# Patient Record
Sex: Female | Born: 1965 | Race: Black or African American | Hispanic: No | Marital: Single | Smoking: Never smoker
Health system: Southern US, Community
[De-identification: ages and names within clinical notes are randomized; demographics above are authoritative.]

## PROBLEM LIST (undated history)

## (undated) DIAGNOSIS — I639 Cerebral infarction, unspecified: Secondary | ICD-10-CM

## (undated) DIAGNOSIS — J329 Chronic sinusitis, unspecified: Secondary | ICD-10-CM

## (undated) DIAGNOSIS — K220 Achalasia of cardia: Secondary | ICD-10-CM

## (undated) DIAGNOSIS — I10 Essential (primary) hypertension: Secondary | ICD-10-CM

## (undated) DIAGNOSIS — D649 Anemia, unspecified: Secondary | ICD-10-CM

## (undated) DIAGNOSIS — J302 Other seasonal allergic rhinitis: Secondary | ICD-10-CM

## (undated) DIAGNOSIS — E7800 Pure hypercholesterolemia, unspecified: Secondary | ICD-10-CM

## (undated) DIAGNOSIS — H548 Legal blindness, as defined in USA: Secondary | ICD-10-CM

## (undated) HISTORY — PX: SKIN BIOPSY: SHX1

## (undated) HISTORY — DX: Cerebral infarction, unspecified: I63.9

## (undated) HISTORY — PX: VITRECTOMY: SHX106

## (undated) HISTORY — PX: BREAST SURGERY: SHX581

## (undated) HISTORY — PX: BREAST EXCISIONAL BIOPSY: SUR124

---

## 1997-12-14 ENCOUNTER — Encounter: Admission: RE | Admit: 1997-12-14 | Discharge: 1998-03-14 | Payer: Self-pay | Admitting: Internal Medicine

## 1998-04-03 HISTORY — PX: EYE SURGERY: SHX253

## 1998-04-21 ENCOUNTER — Ambulatory Visit (HOSPITAL_BASED_OUTPATIENT_CLINIC_OR_DEPARTMENT_OTHER): Admission: RE | Admit: 1998-04-21 | Discharge: 1998-04-21 | Payer: Self-pay | Admitting: *Deleted

## 1998-07-07 ENCOUNTER — Emergency Department (HOSPITAL_COMMUNITY): Admission: EM | Admit: 1998-07-07 | Discharge: 1998-07-07 | Payer: Self-pay | Admitting: Emergency Medicine

## 1998-07-10 ENCOUNTER — Emergency Department (HOSPITAL_COMMUNITY): Admission: EM | Admit: 1998-07-10 | Discharge: 1998-07-10 | Payer: Self-pay | Admitting: Emergency Medicine

## 1999-04-01 ENCOUNTER — Other Ambulatory Visit: Admission: RE | Admit: 1999-04-01 | Discharge: 1999-04-01 | Payer: Self-pay | Admitting: *Deleted

## 2000-04-16 ENCOUNTER — Other Ambulatory Visit: Admission: RE | Admit: 2000-04-16 | Discharge: 2000-04-16 | Payer: Self-pay | Admitting: *Deleted

## 2000-04-19 ENCOUNTER — Encounter: Payer: Self-pay | Admitting: *Deleted

## 2000-04-19 ENCOUNTER — Encounter: Admission: RE | Admit: 2000-04-19 | Discharge: 2000-04-19 | Payer: Self-pay | Admitting: Family Medicine

## 2000-10-15 ENCOUNTER — Encounter: Payer: Self-pay | Admitting: Obstetrics and Gynecology

## 2000-10-15 ENCOUNTER — Encounter: Admission: RE | Admit: 2000-10-15 | Discharge: 2000-10-15 | Payer: Self-pay | Admitting: Obstetrics and Gynecology

## 2001-04-09 ENCOUNTER — Encounter: Payer: Self-pay | Admitting: Obstetrics and Gynecology

## 2001-04-09 ENCOUNTER — Encounter: Admission: RE | Admit: 2001-04-09 | Discharge: 2001-04-09 | Payer: Self-pay | Admitting: Obstetrics and Gynecology

## 2001-05-01 ENCOUNTER — Other Ambulatory Visit: Admission: RE | Admit: 2001-05-01 | Discharge: 2001-05-01 | Payer: Self-pay | Admitting: Obstetrics and Gynecology

## 2001-10-18 ENCOUNTER — Encounter: Admission: RE | Admit: 2001-10-18 | Discharge: 2001-10-18 | Payer: Self-pay | Admitting: Obstetrics and Gynecology

## 2001-10-18 ENCOUNTER — Encounter: Payer: Self-pay | Admitting: Obstetrics and Gynecology

## 2005-08-08 ENCOUNTER — Encounter: Admission: RE | Admit: 2005-08-08 | Discharge: 2005-08-08 | Payer: Self-pay | Admitting: Obstetrics and Gynecology

## 2006-02-21 ENCOUNTER — Encounter: Admission: RE | Admit: 2006-02-21 | Discharge: 2006-02-21 | Payer: Self-pay | Admitting: *Deleted

## 2006-03-08 ENCOUNTER — Encounter: Admission: RE | Admit: 2006-03-08 | Discharge: 2006-03-08 | Payer: Self-pay | Admitting: *Deleted

## 2006-03-13 ENCOUNTER — Encounter: Admission: RE | Admit: 2006-03-13 | Discharge: 2006-03-13 | Payer: Self-pay | Admitting: *Deleted

## 2006-04-03 HISTORY — PX: BREAST BIOPSY: SHX20

## 2006-04-27 ENCOUNTER — Encounter (INDEPENDENT_AMBULATORY_CARE_PROVIDER_SITE_OTHER): Payer: Self-pay | Admitting: *Deleted

## 2006-04-27 ENCOUNTER — Ambulatory Visit (HOSPITAL_COMMUNITY): Admission: RE | Admit: 2006-04-27 | Discharge: 2006-04-27 | Payer: Self-pay | Admitting: *Deleted

## 2006-08-13 ENCOUNTER — Encounter: Admission: RE | Admit: 2006-08-13 | Discharge: 2006-08-13 | Payer: Self-pay | Admitting: Obstetrics and Gynecology

## 2007-08-22 ENCOUNTER — Encounter: Admission: RE | Admit: 2007-08-22 | Discharge: 2007-08-22 | Payer: Self-pay | Admitting: Obstetrics and Gynecology

## 2008-01-19 IMAGING — MG MM DIAGNOSTIC UNILATERAL R
6 series · 6 of 6 positions shown · non-contrast
Comparison: none

DG DIAGNOSTIC UNILATERAL R
CC and MLO view(s) were taken of the right breast.

RIGHT BREAST ULTRASOUND
DIGITAL UNILATERAL RIGHT DIAGNOSTIC MAMMOGRAM AND RIGHT BREAST ULTRASOUND:
CLINICAL DATA: 40-year-old with mass, right breast.

[R CC (1 of 2)]
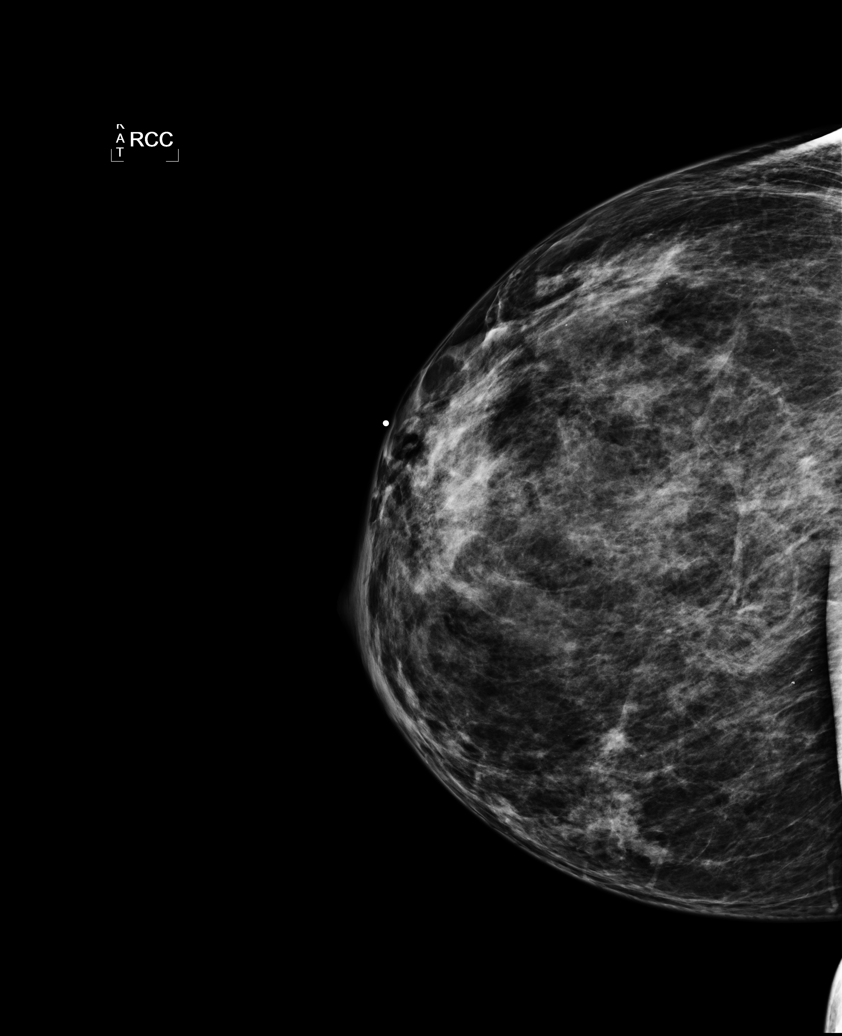

[R MLO]
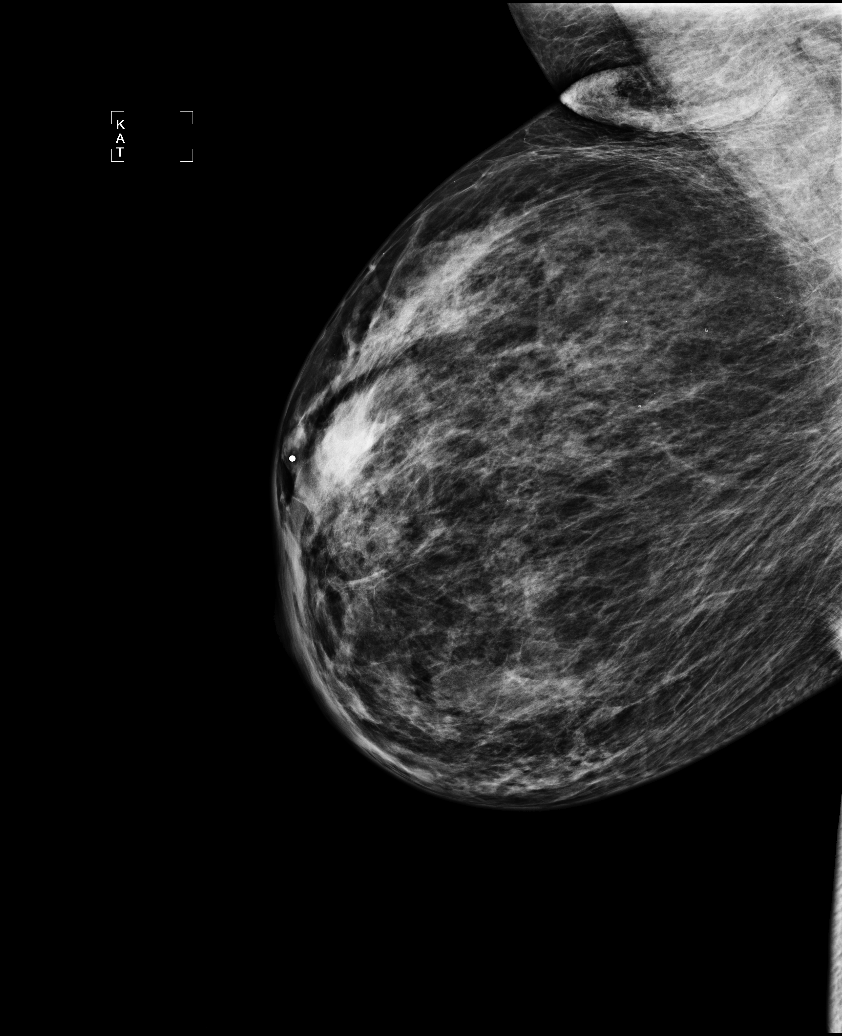

[R TAN (1 of 2)]
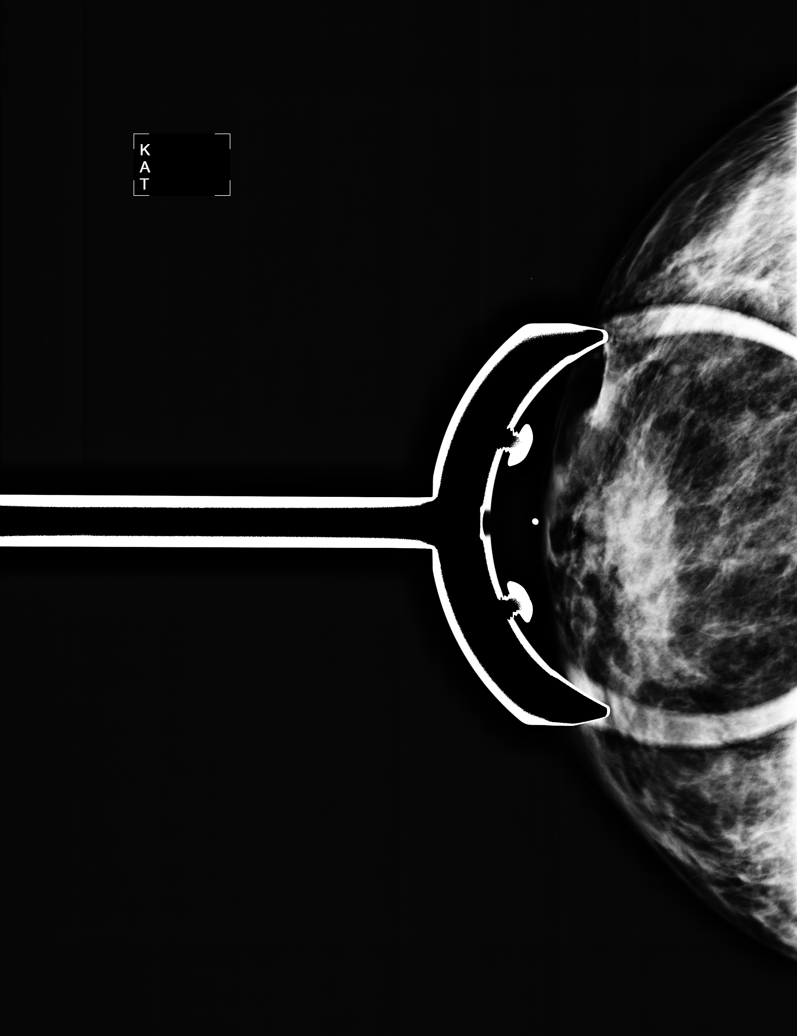

[R TAN (2 of 2)]
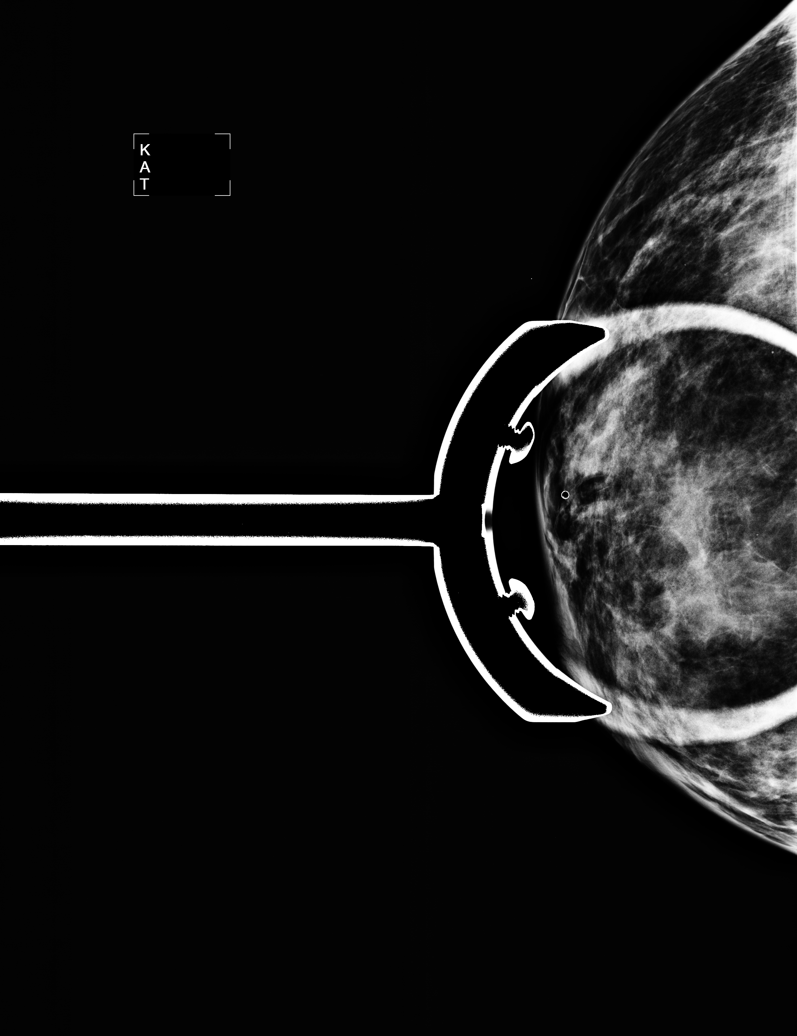

[R CC (2 of 2)]
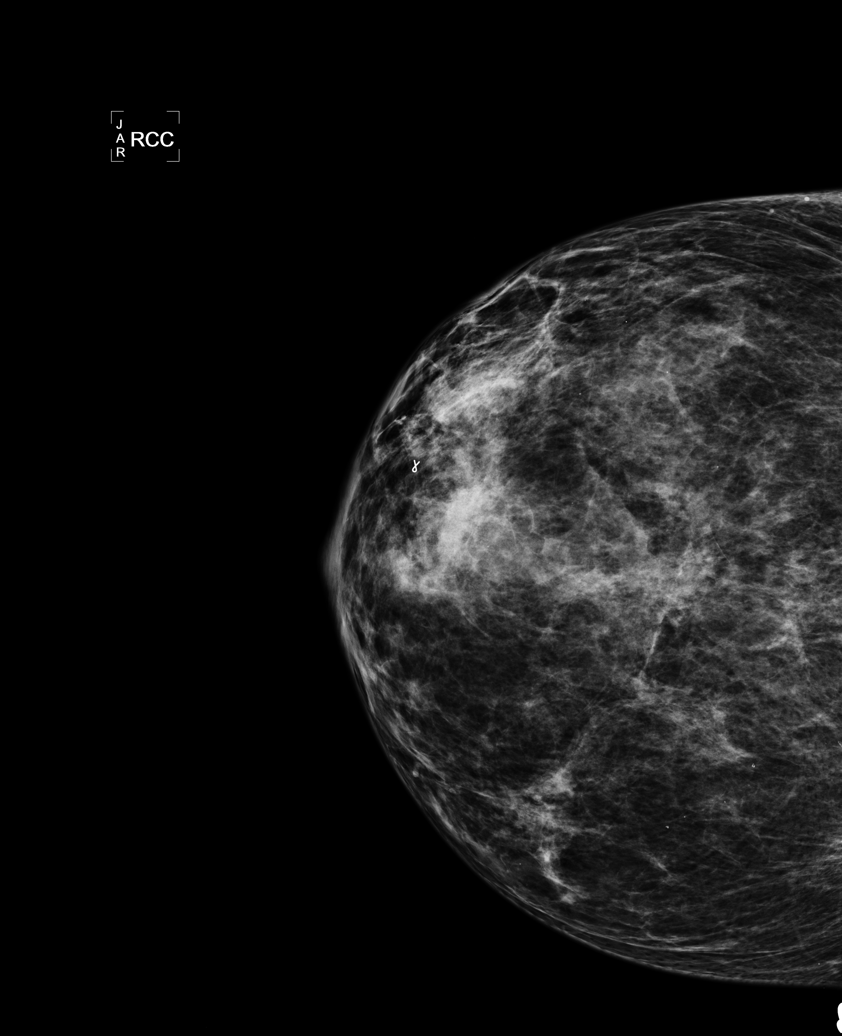

[R ML]
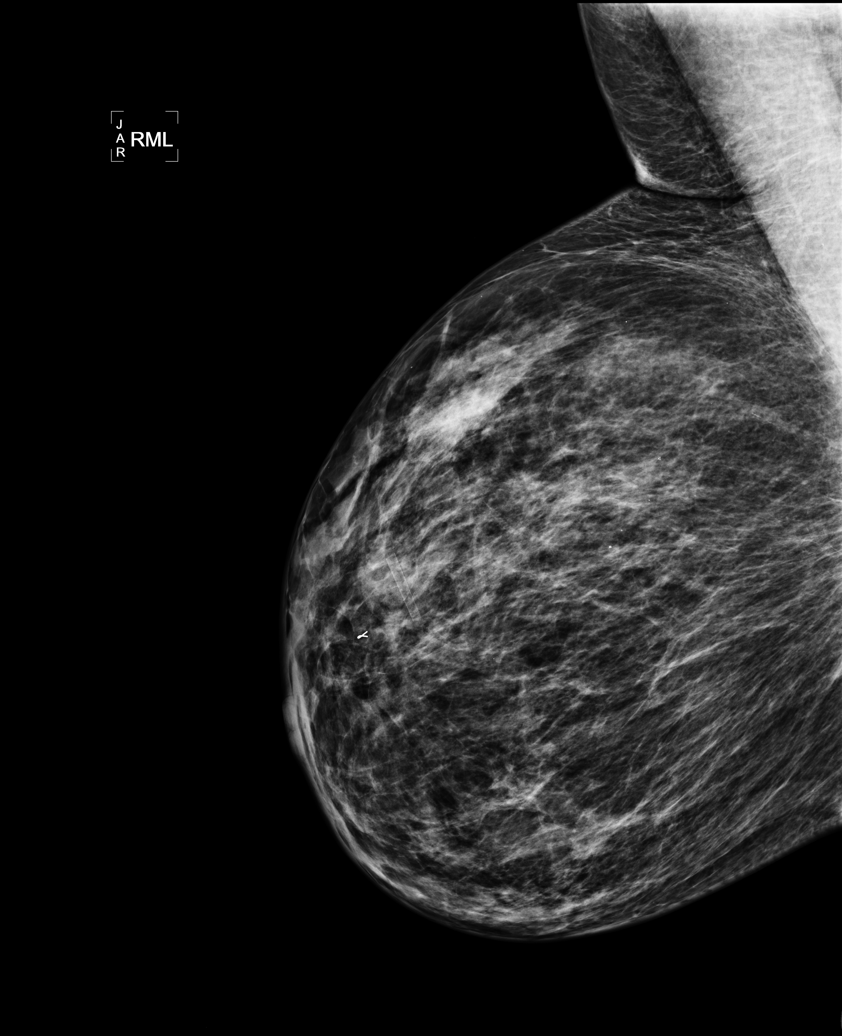

[6 of 6 positions shown; findings below may reference images not displayed]

Comparison 08-08-05.  Breast parenchyma is dense fibroglandular.  There is asymmetric density in the 
anterior portion of the breast in the upper outer quadrant on the right compared to the left.  No 
discrete mass or suspicious calcifications are identified.

On physical exam, I do palpate a firm mass in the 10 - 11 o'clock position of the right breast.  
Ultrasound is performed of this region showing irregular hypoechoic mass associated with dense 
acoustic shadowing.  This is at least 3.2 x 1.6 cm.  The findings are suspicious for malignancy and
biopsy is suggested.  Images of the right axilla show no enlarged axillary lymph nodes.
IMPRESSION: Biopsy is suggested for palpable mass in the 10 o'clock position of the right breast.

ASSESSMENT: Suspicious - BI-RADS 4

Needle biopsy of the right breast.
,

## 2008-08-24 ENCOUNTER — Encounter: Admission: RE | Admit: 2008-08-24 | Discharge: 2008-08-24 | Payer: Self-pay | Admitting: Obstetrics and Gynecology

## 2009-08-25 ENCOUNTER — Encounter: Admission: RE | Admit: 2009-08-25 | Discharge: 2009-08-25 | Payer: Self-pay | Admitting: Obstetrics and Gynecology

## 2010-04-24 ENCOUNTER — Encounter: Payer: Self-pay | Admitting: Obstetrics and Gynecology

## 2010-07-21 ENCOUNTER — Other Ambulatory Visit: Payer: Self-pay | Admitting: Obstetrics and Gynecology

## 2010-07-21 DIAGNOSIS — Z1231 Encounter for screening mammogram for malignant neoplasm of breast: Secondary | ICD-10-CM

## 2010-08-19 NOTE — Op Note (Signed)
Jill Foley, Jill Foley             ACCOUNT NO.:  [id number]   MEDICAL RECORD NO.:  [id number]          PATIENT TYPE:  AMB   LOCATION:  DAY                          FACILITY:  [facility]   PHYSICIAN:  Alfonse Ras, MD   DATE OF BIRTH:  08-04-1965   DATE OF PROCEDURE:  04/27/2006  DATE OF DISCHARGE:                               OPERATIVE REPORT   PREOPERATIVE DIAGNOSIS:  Right breast mass.   POSTOPERATIVE DIAGNOSIS:  Right breast mass.   PROCEDURE:  Right breast biopsy.   SURGEON:  Dr. Baruch Merl.   ANESTHESIA:  General.   DESCRIPTION OF PROCEDURE:  The patient was taken to the operating room,  placed in supine position. After adequate anesthesia was induced using  laryngeal mask, the right breast was prepped and draped in normal  sterile fashion. Using a curvilinear in the upper outer quadrant of the  right breast near the areolae, I dissected down onto appeared to be very  very fibrotic dense tissue. This was consistent with a mass effect.  This was excised in its entirety using Bovie electrocautery and sent for  pathologic evaluation.  Adequate hemostasis was ensured and the skin was  closed with a subcuticular 3-0 Monocryl.  Steri-Strips and dressings  were applied.  The patient tolerated the procedure well and went to PACU  in good condition.      Alfonse Ras, MD  Electronically Signed     KRE/MEDQ  D:  04/27/2006  T:  04/27/2006  Job:  [id number]

## 2010-08-30 ENCOUNTER — Ambulatory Visit: Payer: Self-pay

## 2010-09-01 ENCOUNTER — Ambulatory Visit
Admission: RE | Admit: 2010-09-01 | Discharge: 2010-09-01 | Disposition: A | Discharge status: Home / Self Care | Payer: Medicare Other | Source: Ambulatory Visit | Attending: Obstetrics and Gynecology | Admitting: Obstetrics and Gynecology

## 2010-09-01 DIAGNOSIS — Z1231 Encounter for screening mammogram for malignant neoplasm of breast: Secondary | ICD-10-CM

## 2010-09-28 ENCOUNTER — Other Ambulatory Visit: Payer: Self-pay | Admitting: Obstetrics and Gynecology

## 2011-04-04 DIAGNOSIS — I639 Cerebral infarction, unspecified: Secondary | ICD-10-CM

## 2011-04-04 HISTORY — DX: Cerebral infarction, unspecified: I63.9

## 2011-07-31 ENCOUNTER — Other Ambulatory Visit: Payer: Self-pay | Admitting: Obstetrics and Gynecology

## 2011-07-31 DIAGNOSIS — Z1231 Encounter for screening mammogram for malignant neoplasm of breast: Secondary | ICD-10-CM

## 2011-09-04 ENCOUNTER — Ambulatory Visit
Admission: RE | Admit: 2011-09-04 | Discharge: 2011-09-04 | Disposition: A | Discharge status: Home / Self Care | Payer: Medicare Other | Source: Ambulatory Visit | Attending: Obstetrics and Gynecology | Admitting: Obstetrics and Gynecology

## 2011-09-04 DIAGNOSIS — Z1231 Encounter for screening mammogram for malignant neoplasm of breast: Secondary | ICD-10-CM

## 2011-10-01 ENCOUNTER — Emergency Department (HOSPITAL_BASED_OUTPATIENT_CLINIC_OR_DEPARTMENT_OTHER): Payer: No Typology Code available for payment source

## 2011-10-01 ENCOUNTER — Encounter (HOSPITAL_BASED_OUTPATIENT_CLINIC_OR_DEPARTMENT_OTHER): Payer: Self-pay | Admitting: *Deleted

## 2011-10-01 ENCOUNTER — Emergency Department (HOSPITAL_BASED_OUTPATIENT_CLINIC_OR_DEPARTMENT_OTHER)
Admission: EM | Admit: 2011-10-01 | Discharge: 2011-10-01 | Disposition: A | Discharge status: Home / Self Care | Payer: No Typology Code available for payment source | Attending: Emergency Medicine | Admitting: Emergency Medicine

## 2011-10-01 DIAGNOSIS — Z79899 Other long term (current) drug therapy: Secondary | ICD-10-CM | POA: Insufficient documentation

## 2011-10-01 DIAGNOSIS — Y92410 Unspecified street and highway as the place of occurrence of the external cause: Secondary | ICD-10-CM | POA: Insufficient documentation

## 2011-10-01 DIAGNOSIS — M25569 Pain in unspecified knee: Secondary | ICD-10-CM | POA: Insufficient documentation

## 2011-10-01 DIAGNOSIS — E7800 Pure hypercholesterolemia, unspecified: Secondary | ICD-10-CM | POA: Insufficient documentation

## 2011-10-01 DIAGNOSIS — M545 Low back pain, unspecified: Secondary | ICD-10-CM | POA: Insufficient documentation

## 2011-10-01 DIAGNOSIS — Z7982 Long term (current) use of aspirin: Secondary | ICD-10-CM | POA: Insufficient documentation

## 2011-10-01 DIAGNOSIS — I10 Essential (primary) hypertension: Secondary | ICD-10-CM | POA: Insufficient documentation

## 2011-10-01 DIAGNOSIS — Z9109 Other allergy status, other than to drugs and biological substances: Secondary | ICD-10-CM | POA: Insufficient documentation

## 2011-10-01 DIAGNOSIS — E119 Type 2 diabetes mellitus without complications: Secondary | ICD-10-CM | POA: Insufficient documentation

## 2011-10-01 DIAGNOSIS — S39012A Strain of muscle, fascia and tendon of lower back, initial encounter: Secondary | ICD-10-CM

## 2011-10-01 HISTORY — DX: Chronic sinusitis, unspecified: J32.9

## 2011-10-01 HISTORY — DX: Essential (primary) hypertension: I10

## 2011-10-01 HISTORY — DX: Other seasonal allergic rhinitis: J30.2

## 2011-10-01 HISTORY — DX: Pure hypercholesterolemia, unspecified: E78.00

## 2011-10-01 MED ORDER — CYCLOBENZAPRINE HCL 5 MG PO TABS: 5.0000 mg | ORAL_TABLET | Freq: Two times a day (BID) | ORAL | Status: AC | PRN

## 2011-10-01 MED ORDER — CYCLOBENZAPRINE HCL 10 MG PO TABS
5.0000 mg | ORAL_TABLET | Freq: Once | ORAL | Status: AC
  Administered 2011-10-01: 5 mg via ORAL
  Filled 2011-10-01: qty 1

## 2011-10-01 MED ORDER — HYDROCODONE-ACETAMINOPHEN 5-500 MG PO TABS: 1.0000 | ORAL_TABLET | Freq: Four times a day (QID) | ORAL | Status: AC | PRN
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
## 2011-10-01 NOTE — ED Notes (Signed)
Pt c/o muscle spasms in her left leg. Given warm compress and VP advised.

## 2011-10-01 NOTE — ED Provider Notes (Signed)
History/physical exam/procedure(s) were performed by non-physician practitioner and as supervising physician I was immediately available for consultation/collaboration. I have reviewed all notes and am in agreement with care and plan.   Hilario Quarry, MD 10/01/11 2127
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
## 2011-10-06 ENCOUNTER — Encounter: Payer: Self-pay | Admitting: Family Medicine

## 2011-10-06 ENCOUNTER — Ambulatory Visit (INDEPENDENT_AMBULATORY_CARE_PROVIDER_SITE_OTHER): Payer: Medicare Other | Admitting: Family Medicine

## 2011-10-06 VITALS — BP 118/70 | Ht 70.0 in | Wt 195.0 lb

## 2011-10-06 DIAGNOSIS — M545 Low back pain: Secondary | ICD-10-CM
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
## 2011-10-09 DIAGNOSIS — M545 Low back pain: Secondary | ICD-10-CM | POA: Insufficient documentation
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
## 2011-10-20 ENCOUNTER — Emergency Department (HOSPITAL_BASED_OUTPATIENT_CLINIC_OR_DEPARTMENT_OTHER): Payer: Medicare Other

## 2011-10-20 ENCOUNTER — Encounter (HOSPITAL_BASED_OUTPATIENT_CLINIC_OR_DEPARTMENT_OTHER): Payer: Self-pay | Admitting: *Deleted

## 2011-10-20 ENCOUNTER — Observation Stay (HOSPITAL_BASED_OUTPATIENT_CLINIC_OR_DEPARTMENT_OTHER)
Admission: EM | Admit: 2011-10-20 | Discharge: 2011-10-21 | Disposition: A | Discharge status: Home / Self Care | Payer: Medicare Other | Attending: Emergency Medicine | Admitting: Emergency Medicine

## 2011-10-20 ENCOUNTER — Observation Stay (HOSPITAL_COMMUNITY): Payer: Medicare Other

## 2011-10-20 DIAGNOSIS — R209 Unspecified disturbances of skin sensation: Principal | ICD-10-CM | POA: Insufficient documentation

## 2011-10-20 DIAGNOSIS — I10 Essential (primary) hypertension: Secondary | ICD-10-CM | POA: Insufficient documentation

## 2011-10-20 DIAGNOSIS — E119 Type 2 diabetes mellitus without complications: Secondary | ICD-10-CM | POA: Insufficient documentation

## 2011-10-20 DIAGNOSIS — R202 Paresthesia of skin: Secondary | ICD-10-CM

## 2011-10-20 DIAGNOSIS — R9089 Other abnormal findings on diagnostic imaging of central nervous system: Secondary | ICD-10-CM

## 2011-10-20 DIAGNOSIS — E7800 Pure hypercholesterolemia, unspecified: Secondary | ICD-10-CM | POA: Insufficient documentation

## 2011-10-20 DIAGNOSIS — G459 Transient cerebral ischemic attack, unspecified: Secondary | ICD-10-CM

## 2011-10-20 HISTORY — DX: Achalasia of cardia: K22.0

## 2011-10-20 LAB — COMPREHENSIVE METABOLIC PANEL
CO2: 27 mEq/L (ref 19–32)
Calcium: 9.3 mg/dL (ref 8.4–10.5)
Creatinine, Ser: 1.2 mg/dL — ABNORMAL HIGH (ref 0.50–1.10)
GFR calc Af Amer: 62 mL/min — ABNORMAL LOW (ref >90)
GFR calc non Af Amer: 53 mL/min — ABNORMAL LOW (ref >90)
Glucose, Bld: 130 mg/dL — ABNORMAL HIGH (ref 70–99)

## 2011-10-20 LAB — CBC
Hemoglobin: 11.2 g/dL — ABNORMAL LOW (ref 12.0–15.0)
MCH: 25.9 pg — ABNORMAL LOW (ref 26.0–34.0)
MCV: 81.3 fL (ref 78.0–100.0)
RBC: 4.32 MIL/uL (ref 3.87–5.11)

## 2011-10-20 LAB — URINALYSIS, ROUTINE W REFLEX MICROSCOPIC
Glucose, UA: NEGATIVE mg/dL
Hgb urine dipstick: NEGATIVE
Ketones, ur: NEGATIVE mg/dL
Protein, ur: NEGATIVE mg/dL

## 2011-10-20 LAB — PROTIME-INR: Prothrombin Time: 13.3 seconds (ref 11.6–15.2)

## 2011-10-20 LAB — HEMOGLOBIN A1C: Mean Plasma Glucose: 140 mg/dL — ABNORMAL HIGH (ref <117)

## 2011-10-20 MED ORDER — ENOXAPARIN SODIUM 40 MG/0.4ML ~~LOC~~ SOLN
40.0000 mg | SUBCUTANEOUS | Status: DC
  Administered 2011-10-20: 40 mg via SUBCUTANEOUS
  Filled 2011-10-20 (×2): qty 0.4

## 2011-10-20 MED ORDER — ASPIRIN 325 MG PO TABS
325.0000 mg | ORAL_TABLET | Freq: Once | ORAL | Status: AC
  Administered 2011-10-20: 325 mg via ORAL
  Filled 2011-10-20: qty 1

## 2011-10-20 NOTE — ED Notes (Signed)
Pt transported to MRI 

## 2011-10-20 NOTE — ED Notes (Signed)
Patient states she was showering approximately on hour pta and developed a sudden tingling in her right ear which began to radiated down right neck and arm.  States approximately 15 pta she developed light headedness, lasting approximatley 5 minutes which resolved with rest. Denies any lightheadedness at this time.

## 2011-10-20 NOTE — ED Notes (Signed)
Report given to Lori in CDU. 
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
## 2011-10-20 NOTE — Progress Notes (Signed)
*  PRELIMINARY RESULTS* Vascular Ultrasound Carotid Duplex (Doppler) has been completed.  Preliminary findings: Bilaterally no significant ICA stenosis with antegrade vertebral flow.  Farrel Demark RDMS 10/20/2011, 7:28 PM

## 2011-10-20 NOTE — ED Provider Notes (Signed)
Medical screening examination/treatment/procedure(s) were performed by non-physician practitioner and as supervising physician I was immediately available for consultation/collaboration.   Reice Bienvenue, MD 10/20/11 2350 

## 2011-10-20 NOTE — ED Provider Notes (Signed)
Pt with right ear, arm tingling onset this morning. Pt was transferred here, from [facility]. Pt is being taken care of by Dr. Ranae Palms and PA Storm Frisk. I did however, went in to check on her. Her symptoms are all resolved. Her results of MRI and labs were discussed with her. Pt aware of a plan to have ECHO and lipids tomorrow am, disposition based on results.   Lottie Mussel, PA 10/20/11 2229

## 2011-10-20 NOTE — ED Notes (Signed)
Pt returned from MRI °

## 2011-10-20 NOTE — ED Notes (Signed)
[facility] has arrived  
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
## 2011-10-20 NOTE — ED Provider Notes (Signed)
History     CSN: [id number]  Arrival date & time 10/20/11  1223   First MD Initiated Contact with Patient 10/20/11 1245      Chief Complaint  Patient presents with  . Tingling    right ear, neck and arm    (Consider location/radiation/quality/duration/timing/severity/associated sxs/prior treatment) HPI Pt with multiple medical problems reports a short time prior to arrival while taking a shower she began to have tingling in her R face, progressed to R arm and leg and lasted for less than an hour. She still has some residual ringing in R ear and tingling in R hand, but symptoms have improved significantly. She did not have any facial droop, slurred speech or weakness.   Past Medical History  Diagnosis Date  . Diabetes mellitus   . Hypertension   . Hypercholesteremia   . Seasonal allergies   . Sinus infection   . Achalasia     Past Surgical History  Procedure Date  . Skin biopsy   . Eye surgery   . Breast surgery     Family History  Problem Relation Age of Onset  . Hypertension Mother   . Diabetes Mother   . Hypertension Father   . Sudden death Neg Hx   . Heart attack Neg Hx   . Diabetes Other   . Hypertension Other   . Hyperlipidemia Other     History  Substance Use Topics  . Smoking status: Never Smoker   . Smokeless tobacco: Not on file  . Alcohol Use: No    OB History    Grav Para Term Preterm Abortions TAB SAB Ect Mult Living                  Review of Systems All other systems reviewed and are negative except as noted in HPI.   Allergies  Fish allergy and Ivp dye  Home Medications   Current Outpatient Rx  Name Route Sig Dispense Refill  . ASPIRIN 81 MG PO TABS Oral Take 81 mg by mouth daily.    Marland Kitchen BIOTIN PO Oral Take 1 tablet by mouth daily.    Marland Kitchen VITAMIN D 1000 UNITS PO TABS Oral Take 2,000 Units by mouth daily.    Marland Kitchen FLUTICASONE PROPIONATE 50 MCG/ACT NA SUSP Nasal Place 1 spray into the nose every morning.    Marland Kitchen GABAPENTIN PO Oral Take 1  capsule by mouth 2 (two) times daily.    Marland Kitchen LISINOPRIL-HYDROCHLOROTHIAZIDE 20-12.5 MG PO TABS Oral Take 1 tablet by mouth daily.    Marland Kitchen MAGNESIUM OXIDE PO Oral Take 1 tablet by mouth daily.    . MELOXICAM PO Oral Take 1 tablet by mouth daily.    Marland Kitchen METFORMIN HCL 500 MG PO TABS Oral Take 500 mg by mouth 2 (two) times daily with a meal.    . METHYLCELLULOSE 1 % OP SOLN Both Eyes Place 1 drop into both eyes 2 (two) times daily.    Marland Kitchen PRAVASTATIN SODIUM 40 MG PO TABS Oral Take 40 mg by mouth daily.      BP 141/85  Pulse 88  Temp 98.2 F (36.8 C) (Oral)  Resp 20  SpO2 100%  Physical Exam  Nursing note and vitals reviewed. Constitutional: She is oriented to person, place, and time. She appears well-developed and well-nourished.  HENT:  Head: Normocephalic and atraumatic.  Right Ear: Tympanic membrane and ear canal normal.  Left Ear: Tympanic membrane normal.       L ear cerumen impaction removed  with curette  Eyes: EOM are normal. Pupils are equal, round, and reactive to light.  Neck: Normal range of motion. Neck supple.  Cardiovascular: Normal rate, normal heart sounds and intact distal pulses.   Pulmonary/Chest: Effort normal and breath sounds normal.  Abdominal: Bowel sounds are normal. She exhibits no distension. There is no tenderness.  Musculoskeletal: Normal range of motion. She exhibits no edema and no tenderness.  Neurological: She is alert and oriented to person, place, and time. She has normal strength and normal reflexes. No cranial nerve deficit or sensory deficit. Coordination and gait normal.       Subjective change in sensation to R hand, but fine touch sensation is intact  Skin: Skin is warm and dry. No rash noted.  Psychiatric: She has a normal mood and affect.    ED Course  Procedures (including critical care time)  Labs Reviewed  CBC - Abnormal; Notable for the following:    Hemoglobin 11.2 (*)     HCT 35.1 (*)     MCH 25.9 (*)     All other components within  normal limits  COMPREHENSIVE METABOLIC PANEL - Abnormal; Notable for the following:    Glucose, Bld 130 (*)     Creatinine, Ser 1.20 (*)     Total Bilirubin 0.2 (*)     GFR calc non Af Amer 53 (*)     GFR calc Af Amer 62 (*)     All other components within normal limits  PROTIME-INR  APTT  URINALYSIS, ROUTINE W REFLEX MICROSCOPIC  HEMOGLOBIN A1C   Ct Head Wo Contrast  10/20/2011  *RADIOLOGY REPORT*  Clinical Data: Bowing of the right side  CT HEAD WITHOUT CONTRAST  Technique:  Contiguous axial images were obtained from the base of the skull through the vertex without contrast.  Comparison: None.  Findings: The brain has a normal appearance without evidence of malformation, atrophy, old or acute infarction, mass lesion, hemorrhage, hydrocephalus or extra-axial collection.  The calvarium is unremarkable.  Sinuses, middle ears and mastoids are clear.  IMPRESSION: Normal head CT  Original Report Authenticated By: Thomasenia Sales, M.D.     No diagnosis found.    MDM  Likely TIA. Will begin workup here, but anticipate transfer to [facility] for TIA Obs protocol.     Date: 10/20/2011  Rate: 75  Rhythm: normal sinus rhythm  QRS Axis: normal  Intervals: normal  ST/T Wave abnormalities: normal  Conduction Disutrbances: none  Narrative Interpretation: unremarkable No change from previous  3:22 PM Pt's symptoms unchanged. CT and labs as above. Discussed with Dr. Ranae Palms and Lemont Fillers, PA at [facility] who will accept in transfer to [facility] for TIA protocol.      Janann Boeve B. Bernette Mayers, MD 10/20/11 1524

## 2011-10-20 NOTE — ED Provider Notes (Signed)
Medical screening examination/treatment/procedure(s) were performed by non-physician practitioner and as supervising physician I was immediately available for consultation/collaboration.   Kaiser Belluomini, MD 10/20/11 2349 

## 2011-10-21 LAB — LIPID PANEL
Cholesterol: 165 mg/dL (ref 0–200)
HDL: 85 mg/dL (ref >39)
Total CHOL/HDL Ratio: 1.9 RATIO

## 2011-10-21 MED ORDER — STROKE: EARLY STAGES OF RECOVERY BOOK
Freq: Once | Status: DC
  Filled 2011-10-21: qty 1

## 2011-10-21 MED ORDER — ASPIRIN 81 MG PO CHEW: 81.0000 mg | CHEWABLE_TABLET | Freq: Every day | ORAL | Status: DC

## 2011-10-21 NOTE — ED Notes (Cosign Needed)
11:36 AM Pt seen and examined by me again in CDU this am. Pt overnight on TIA protocol. Pt has completed all the testing, however, still pending results of an echocardiogram.   Pt in NAD. Resting comfortably. Lungs clear bilat, regular hr and rhythm. Neurovascularly intact. Asymptomatic at present.     Results were reviewd, I spoke with Dr. Thad Ranger, neurology, regarding the findings on the MRI brain, and she does not believe there is any evidence of an acute ischemia, and does not believe a diagnosis of demyelinating disease could be made from this particular study. She recommended starting pt on 81mg  asa daily and outpatient follow up with neurology.   11:50 AM Echo normal. . PT informed of all the findings. Suspicion for pre diabetes. Pt will be followed up with PCP and neurology for further evaluation and treatment. Will start on baby asa.   Pt in NAD, VS normal, regular HR and rhythm, lungs clear bilaterally, no neurofocal deficits, normal strength, coordination, gait.   Pt stable for d/c home.   Lottie Mussel, PA 10/21/11 1151

## 2011-10-21 NOTE — Progress Notes (Signed)
  Echocardiogram 2D Echocardiogram has been performed.  Georgian Co 10/21/2011, 9:30 AM

## 2011-10-30 ENCOUNTER — Other Ambulatory Visit: Payer: Self-pay | Admitting: Neurology

## 2011-10-30 DIAGNOSIS — R9409 Abnormal results of other function studies of central nervous system: Secondary | ICD-10-CM

## 2011-11-03 ENCOUNTER — Ambulatory Visit: Payer: Medicare Other | Admitting: Family Medicine

## 2011-11-03 ENCOUNTER — Ambulatory Visit (INDEPENDENT_AMBULATORY_CARE_PROVIDER_SITE_OTHER): Payer: Medicare Other | Admitting: Family Medicine

## 2011-11-03 VITALS — BP 122/76 | HR 69 | Ht 70.0 in | Wt 202.0 lb

## 2011-11-03 DIAGNOSIS — M545 Low back pain: Secondary | ICD-10-CM

## 2011-11-04 ENCOUNTER — Ambulatory Visit
Admission: RE | Admit: 2011-11-04 | Discharge: 2011-11-04 | Disposition: A | Discharge status: Home / Self Care | Payer: Medicare Other | Source: Ambulatory Visit | Attending: Neurology | Admitting: Neurology

## 2011-11-04 DIAGNOSIS — R9409 Abnormal results of other function studies of central nervous system: Secondary | ICD-10-CM

## 2011-11-04 MED ORDER — GADOBENATE DIMEGLUMINE 529 MG/ML IV SOLN
19.0000 mL | Freq: Once | INTRAVENOUS | Status: AC | PRN
  Administered 2011-11-04: 19 mL via INTRAVENOUS

## 2011-11-07 ENCOUNTER — Encounter: Payer: Self-pay | Admitting: Family Medicine
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
## 2011-11-15 NOTE — Progress Notes (Signed)
Observation review for this date is complete.

## 2011-12-22 ENCOUNTER — Ambulatory Visit (INDEPENDENT_AMBULATORY_CARE_PROVIDER_SITE_OTHER): Payer: Medicare Other | Admitting: Family Medicine

## 2011-12-22 ENCOUNTER — Encounter: Payer: Self-pay | Admitting: Family Medicine

## 2011-12-22 VITALS — BP 125/73 | HR 71 | Ht 70.0 in | Wt 220.5 lb

## 2011-12-22 DIAGNOSIS — M545 Low back pain: Secondary | ICD-10-CM

## 2011-12-23 ENCOUNTER — Ambulatory Visit (HOSPITAL_BASED_OUTPATIENT_CLINIC_OR_DEPARTMENT_OTHER)
Admission: RE | Admit: 2011-12-23 | Discharge: 2011-12-23 | Disposition: A | Discharge status: Home / Self Care | Payer: Medicare Other | Source: Ambulatory Visit | Attending: Family Medicine | Admitting: Family Medicine

## 2011-12-23 DIAGNOSIS — M5137 Other intervertebral disc degeneration, lumbosacral region: Secondary | ICD-10-CM | POA: Insufficient documentation

## 2011-12-23 DIAGNOSIS — M545 Low back pain, unspecified: Secondary | ICD-10-CM | POA: Insufficient documentation

## 2011-12-23 DIAGNOSIS — M51379 Other intervertebral disc degeneration, lumbosacral region without mention of lumbar back pain or lower extremity pain: Secondary | ICD-10-CM | POA: Insufficient documentation

## 2011-12-23 DIAGNOSIS — M5380 Other specified dorsopathies, site unspecified: Secondary | ICD-10-CM | POA: Insufficient documentation

## 2011-12-23 DIAGNOSIS — M79609 Pain in unspecified limb: Secondary | ICD-10-CM | POA: Insufficient documentation

## 2011-12-23 DIAGNOSIS — R209 Unspecified disturbances of skin sensation: Secondary | ICD-10-CM | POA: Insufficient documentation

## 2011-12-24 ENCOUNTER — Encounter: Payer: Self-pay | Admitting: Family Medicine
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
## 2011-12-26 NOTE — Addendum Note (Signed)
Addended by: Lenda Kelp on: 12/26/2011 08:46 AM   Modules accepted: Orders

## 2012-04-01 ENCOUNTER — Ambulatory Visit: Payer: Medicare Other | Attending: Physical Medicine and Rehabilitation | Admitting: Rehabilitation

## 2012-04-01 DIAGNOSIS — M545 Low back pain, unspecified: Secondary | ICD-10-CM | POA: Insufficient documentation

## 2012-04-01 DIAGNOSIS — M2569 Stiffness of other specified joint, not elsewhere classified: Secondary | ICD-10-CM | POA: Insufficient documentation

## 2012-04-01 DIAGNOSIS — IMO0001 Reserved for inherently not codable concepts without codable children: Secondary | ICD-10-CM | POA: Insufficient documentation

## 2012-04-05 ENCOUNTER — Ambulatory Visit: Payer: Medicare Other | Attending: Physical Medicine and Rehabilitation | Admitting: Rehabilitation

## 2012-04-05 DIAGNOSIS — M2569 Stiffness of other specified joint, not elsewhere classified: Secondary | ICD-10-CM | POA: Insufficient documentation

## 2012-04-05 DIAGNOSIS — M545 Low back pain, unspecified: Secondary | ICD-10-CM | POA: Insufficient documentation

## 2012-04-05 DIAGNOSIS — IMO0001 Reserved for inherently not codable concepts without codable children: Secondary | ICD-10-CM | POA: Insufficient documentation

## 2012-04-09 ENCOUNTER — Ambulatory Visit: Payer: Medicare Other | Admitting: Rehabilitation

## 2012-04-11 ENCOUNTER — Ambulatory Visit: Payer: Medicare Other | Admitting: Rehabilitation

## 2012-04-15 ENCOUNTER — Ambulatory Visit: Payer: Medicare Other | Admitting: Rehabilitation

## 2012-04-17 ENCOUNTER — Ambulatory Visit: Payer: Medicare Other | Admitting: Rehabilitation

## 2012-04-22 ENCOUNTER — Ambulatory Visit: Payer: Medicare Other | Admitting: Rehabilitation

## 2012-04-24 ENCOUNTER — Ambulatory Visit: Payer: Medicare Other | Admitting: Rehabilitation

## 2012-04-25 ENCOUNTER — Ambulatory Visit: Payer: Medicare Other | Admitting: Rehabilitation

## 2012-04-29 ENCOUNTER — Ambulatory Visit: Payer: Medicare Other | Admitting: Rehabilitation

## 2012-05-01 ENCOUNTER — Ambulatory Visit: Payer: Medicare Other | Admitting: Rehabilitation

## 2012-05-03 ENCOUNTER — Ambulatory Visit: Payer: Medicare Other | Admitting: Rehabilitation

## 2012-05-06 ENCOUNTER — Ambulatory Visit: Payer: Medicare Other | Attending: Physical Medicine and Rehabilitation | Admitting: Rehabilitation

## 2012-05-06 DIAGNOSIS — M2569 Stiffness of other specified joint, not elsewhere classified: Secondary | ICD-10-CM | POA: Insufficient documentation

## 2012-05-06 DIAGNOSIS — M545 Low back pain, unspecified: Secondary | ICD-10-CM | POA: Insufficient documentation

## 2012-05-06 DIAGNOSIS — IMO0001 Reserved for inherently not codable concepts without codable children: Secondary | ICD-10-CM | POA: Insufficient documentation

## 2012-05-08 ENCOUNTER — Ambulatory Visit: Payer: Medicare Other | Admitting: Rehabilitation

## 2012-05-14 ENCOUNTER — Ambulatory Visit: Payer: Medicare Other | Admitting: Rehabilitation

## 2012-05-15 ENCOUNTER — Ambulatory Visit: Payer: Medicare Other | Admitting: Rehabilitation

## 2012-05-16 ENCOUNTER — Ambulatory Visit: Payer: Medicare Other | Admitting: Rehabilitation

## 2012-05-20 ENCOUNTER — Ambulatory Visit: Payer: Medicare Other | Admitting: Rehabilitation

## 2012-05-21 ENCOUNTER — Ambulatory Visit: Payer: Medicare Other | Admitting: Rehabilitation

## 2012-05-23 ENCOUNTER — Ambulatory Visit: Payer: Medicare Other | Admitting: Rehabilitation

## 2012-05-28 ENCOUNTER — Ambulatory Visit: Payer: Medicare Other | Admitting: Rehabilitation

## 2012-05-30 ENCOUNTER — Ambulatory Visit: Payer: Medicare Other | Admitting: Rehabilitation

## 2012-07-31 ENCOUNTER — Other Ambulatory Visit: Payer: Self-pay

## 2012-07-31 DIAGNOSIS — Z1231 Encounter for screening mammogram for malignant neoplasm of breast: Secondary | ICD-10-CM

## 2012-08-29 ENCOUNTER — Encounter: Payer: Self-pay | Admitting: Neurology

## 2012-08-29 DIAGNOSIS — R9089 Other abnormal findings on diagnostic imaging of central nervous system: Secondary | ICD-10-CM

## 2012-08-29 DIAGNOSIS — R209 Unspecified disturbances of skin sensation: Secondary | ICD-10-CM | POA: Insufficient documentation

## 2012-08-29 DIAGNOSIS — G459 Transient cerebral ischemic attack, unspecified: Secondary | ICD-10-CM | POA: Insufficient documentation

## 2012-09-04 ENCOUNTER — Encounter: Payer: Self-pay | Admitting: Neurology

## 2012-09-04 ENCOUNTER — Ambulatory Visit (INDEPENDENT_AMBULATORY_CARE_PROVIDER_SITE_OTHER): Payer: Medicare Other | Admitting: Neurology

## 2012-09-04 VITALS — BP 121/72 | HR 76 | Temp 97.9°F | Ht 69.5 in | Wt 192.0 lb

## 2012-09-04 DIAGNOSIS — G459 Transient cerebral ischemic attack, unspecified: Secondary | ICD-10-CM

## 2012-09-04 DIAGNOSIS — E785 Hyperlipidemia, unspecified: Secondary | ICD-10-CM

## 2012-09-04 NOTE — Patient Instructions (Addendum)
Come back for Carotid dopplers studies. Follow up visit in 6 months.  STROKE/TIA INSTRUCTIONS SMOKING Cigarette smoking nearly doubles your risk of having a stroke & is the single most alterable risk factor  If you smoke or have smoked in the last 12 months, you are advised to quit smoking for your health.  Most of the excess cardiovascular risk related to smoking disappears within a year of stopping.  Ask you doctor about anti-smoking medications  [place] Quit Line: 1-800-QUIT NOW  Free Smoking Cessation Classes ([phone]  CHOLESTEROL Know your levels; limit fat & cholesterol in your diet  Lab Results  Component Value Date   CHOL 165 10/21/2011   HDL 85 10/21/2011   LDLCALC 62 10/21/2011   TRIG 92 10/21/2011   CHOLHDL 1.9 10/21/2011      Many patients benefit from treatment even if their cholesterol is at goal.  Goal: Total Cholesterol less than 160  Goal:  LDL less than 100  Goal:  HDL greater than 40  Goal:  Triglycerides less than 150  BLOOD PRESSURE American Stroke Association blood pressure target is less that 120/80 mm/Hg  Your discharge blood pressure is:  BP: 121/72 mmHg  Monitor your blood pressure  Limit your salt and alcohol intake  Many individuals will require more than one medication for high blood pressure  DIABETES (A1c is a blood sugar average for last 3 months) Goal A1c is under 7% (A1c is blood sugar average for last 3 months)  Diabetes: Diagnosis of diabetes:  Your A1c:6.5 %    Lab Results  Component Value Date   HGBA1C 6.5* 10/20/2011    Your A1c can be lowered with medications, healthy diet, and exercise.  Check your blood sugar as directed by your physician  Call your physician if you experience unexplained or low blood sugars.  PHYSICAL ACTIVITY/REHABILITATION Goal is 30 minutes at least 4 days per week    Activity decreases your risk of heart attack and stroke and makes your heart stronger.  It helps control your weight and blood pressure;  helps you relax and can improve your mood.  Participate in a regular exercise program.  Talk with your doctor about the best form of exercise for you (dancing, walking, swimming, cycling).  DIET/WEIGHT Goal is to maintain a healthy weight  Your height is:  Height: 5' 9.5" (176.5 cm) Your current weight is: Weight: 192 lb (87.091 kg) Your body Mass Index (BMI) is:  BMI (Calculated): 28  Following the type of diet specifically designed for you will help prevent another stroke.  Your goal Body Mass Index (BMI) is 19-24.  Healthy food habits can help reduce 3 risk factors for stroke:  High cholesterol, hypertension, and excess weight.

## 2012-09-04 NOTE — Progress Notes (Signed)
[facility]  PATIENT: Jill Foley DOB: [birth year]   HISTORY FROM: patient REASON FOR VISIT: routine follow up  HISTORY OF PRESENT ILLNESS:  Patient is 47 y/o female with a history signigicant for DMII, borderline HTN, hyperlipidemia, and family history of a mother who had a CVA at age 24, who had a TIA on 10/20/11. Patient states she was in the shower when she felt tingling in her right ear, right arm, and right leg. She also began to feel a little light headed. Denies headache or vision changes. She was worked up at [facility] for TIA. 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 The FLAIRr images showed several areas of tiny nonspecific subcortical and periventricular white matter hyperintensities which raised concern for possible demyelinating disease. Echocardiogram was unremarkable. Patient was started on daily aspirin. She does have vascular risk factors of diabetes, hypertension, and hyperlipidemia. 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 She has undergone multiple surgeries mostly in the right eye which she is legally blind in.  Returned since last visit on 03/25/2012. She has been doing well no new paresthesias or recurrent TIA/stroke symptoms. Continues to take aspirin 81 mg daily without significant bleeding for problems. Her most recent hemoglobin A1c was 6.6 which was done at PCP in May of this year. Patient states that her blood pressure is well controlled and usually in the 120s over 80s when she takes it at home. She reports that her lipid panel showed mildly elevated cholesterol, she will try to get the lab results and for them to our office. She was told by her primary doctor's office that her kidney function is slightly worse and that she is now chronic kidney disease stage III.  She has concerns that if his worse since recently starting on  Januvia. She continues to exercise regularly and is trying to lose weight.  REVIEW OF SYSTEMS: Full 14 system review of systems performed and notable only for: constitutional: Blurred vision    ALLERGIES: Allergies  Allergen Reactions  . Fish Allergy Itching  . Ivp Dye (Iodinated Diagnostic Agents) Itching    HOME MEDICATIONS: Outpatient Prescriptions Prior to Visit  Medication Sig Dispense Refill  . aspirin 81 MG chewable tablet Chew 1 tablet (81 mg total) by mouth daily.  30 tablet  0  . aspirin 81 MG tablet Take 81 mg by mouth daily.      Marland Kitchen BIOTIN PO Take 1 tablet by mouth daily.      . cholecalciferol (VITAMIN D) 1000 UNITS tablet Take 2,000 Units by mouth daily.      Marland Kitchen gabapentin (NEURONTIN) 100 MG capsule Take 100 mg by mouth 3 (three) times daily.      Marland Kitchen lisinopril-hydrochlorothiazide (PRINZIDE,ZESTORETIC) 20-12.5 MG per tablet Take 1 tablet by mouth daily.      . Magnesium Oxide 250 MG TABS Take 1 tablet by mouth daily.      . meloxicam (MOBIC) 15 MG tablet Take 15 mg by mouth daily.      . metFORMIN (GLUCOPHAGE) 500 MG tablet Take 500 mg by mouth 2 (two) times daily with a meal.      . methylcellulose (ARTIFICIAL TEARS) 1 % ophthalmic solution Place 1 drop into both eyes 2 (two) times daily.      . pravastatin (PRAVACHOL) 40 MG tablet Take 40 mg by mouth daily.  No facility-administered medications prior to visit.    PAST MEDICAL HISTORY: Past Medical History  Diagnosis Date  . Diabetes mellitus   . Hypertension   . Hypercholesteremia   . Seasonal allergies   . Sinus infection   . Achalasia   . Seizures     PAST SURGICAL HISTORY: Past Surgical History  Procedure Laterality Date  . Skin biopsy    . Eye surgery  2000    cataract   . Breast surgery Bilateral     Biopsy,LFT 2000,Right 2008  . Vitrectomy      lft  1999,Right2002    FAMILY HISTORY: Family History  Problem Relation Age of Onset  . Hypertension Mother   . Diabetes Mother   .  Hypertension Father   . Glaucoma Father   . Sudden death Neg Hx   . Heart attack Neg Hx   . Diabetes Other   . Hypertension Other   . Hyperlipidemia Other     SOCIAL HISTORY: History   Social History  . Marital Status: Single    Spouse Name: N/A    Number of Children: N/A  . Years of Education: BS   Occupational History  . Not on file.   Social History Main Topics  . Smoking status: Never Smoker   . Smokeless tobacco: Not on file  . Alcohol Use: No  . Drug Use: No  . Sexually Active: Not on file   Other Topics Concern  . Not on file   Social History Narrative  . No narrative on file   Filed Vitals:   09/04/12 1350  BP: 121/72  Pulse: 76  Temp: 97.9 F (36.6 C)     PHYSICAL EXAM  GENERAL EXAM: Patient is in no distress, well developed and well groomed. HEAD: Symmetric facial features. EARS, NOSE, and THROAT: Normal.  NECK: Supple, no JVD RESPIRATORY: Lungs CTA. CARDIOVASCULAR: Regular rate and rhythm, no murmurs, no carotid bruits SKIN: No rash, no bruising  NEUROLOGIC: MENTAL STATUS: awake, alert and oriented to person, place and time, language fluent, comprehension intact, naming intact CRANIAL NERVE: Vision acuity is limited in both eyes with legal blindness in the right eye with only recognizing shapes. no nystagmus, facial sensation and strength symmetric, uvula midline, shoulder shrug symmetric, tongue midline. Hearing is normal. ABSENT PERIPHERAL VISION IN BOTH EYES. MOTOR: normal bulk and tone, full strength in the BUE, BLE,fine finger movements normal SENSORY: normal and symmetric to light touch, pinprick, temperature, vibration  COORDINATION: finger-nose-finger REFLEXES: deep tendon reflexes present and symmetric 2+,  GAIT/STATION: narrow based gait; able to walk on toes, heels and tandem; romberg is negative. No assistive device.   DIAGNOSTIC DATA (LABS, IMAGING, TESTING) - I reviewed patient records, labs, notes, testing and imaging myself  where available.   CT Head 10/20/11: unremarkable.  EKG 10/20/11: unremarkable. Carotid doppler 10/20/11: No significant extracranial carotid artery stenosis demonstrated. Vertebrals are patent with antegrade flow. Cardiac echo 10/21/11: The estimated ejection fraction was in the range of 55% to 60%. Unremarkable. MRI HEAD WITHOUT CONTRAST 10/20/11:  1. Punctate area of diffusion signal abnormality in the left dorsal brain stem. I do not favor this is an acute lacunar  infarct, but instead favor either artifact or tiny demyelinating lesion - see #2.  2. Scattered cerebral white matter signal changes which are advanced for age. These are nonspecific, but in light of symptoms consider demyelinating disease.  3. No other acute intracranial abnormality. Postoperative changes to the orbits. MRA HEAD WITHOUT CONTRAST 10/20/11: 1.  Cavernous ICA atherosclerosis without hemodynamically significant stenosis.  2. Otherwise negative intracranial MRA (incidental anatomic variants as noted above).   ASSESSMENT AND PLAN  47 year old [place] American female with transient episode of right body paresthesias of unclear etiology on 10/14/2011 thought to be a left brain TIA secondary to small vessel disease from diabetes, hypertension, hyperlipidemia with normal workup for the demyelinating disease.  Patient is now chronic kidney disease stage III, recommended to her that she get established with a nephrologist.  Continue aspirin 81 mg orally every day  for secondary stroke prevention and maintain strict control of hypertension with blood pressure goal below 130/80, diabetes with hemoglobin A1c goal below 6.5% and lipids with LDL cholesterol goal below 70 mg/dL.  We will order repeat Carotid dopplers today. Requested lipid panel results to be faxed to our office.  Lipid panel to be checked every 6 months. Continue weight loss and healthy diet. Followup in 6 months.   LYNN LAM NP-C 09/04/2012, 1:45 PM  [facility] [address], [address] [place], [place] [address] [phone]  I have personally examined this patient, reviewed pertinent data, developed plan of care and discussed with patient and agree with above.  Delia Heady, MD

## 2012-09-05 ENCOUNTER — Ambulatory Visit
Admission: RE | Admit: 2012-09-05 | Discharge: 2012-09-05 | Disposition: A | Discharge status: Home / Self Care | Payer: Medicare Other | Source: Ambulatory Visit

## 2012-09-05 DIAGNOSIS — Z1231 Encounter for screening mammogram for malignant neoplasm of breast: Secondary | ICD-10-CM

## 2012-10-08 ENCOUNTER — Ambulatory Visit (INDEPENDENT_AMBULATORY_CARE_PROVIDER_SITE_OTHER): Payer: Medicare Other

## 2012-10-08 DIAGNOSIS — G459 Transient cerebral ischemic attack, unspecified: Secondary | ICD-10-CM

## 2012-10-09 ENCOUNTER — Telehealth: Payer: Self-pay | Admitting: Nurse Practitioner

## 2012-10-09 NOTE — Telephone Encounter (Signed)
Called Ms. Prevost with carotid doppler results from 10/08/12, no significant stenosis, patient verbalized understanding and requested that result be sent to PCP, Caffie Damme at [facility], [facility].

## 2013-03-06 ENCOUNTER — Encounter: Payer: Self-pay | Admitting: Nurse Practitioner

## 2013-03-06 ENCOUNTER — Ambulatory Visit (INDEPENDENT_AMBULATORY_CARE_PROVIDER_SITE_OTHER): Payer: Medicare Other | Admitting: Nurse Practitioner

## 2013-03-06 ENCOUNTER — Encounter (INDEPENDENT_AMBULATORY_CARE_PROVIDER_SITE_OTHER): Payer: Self-pay

## 2013-03-06 VITALS — BP 107/60 | HR 95 | Temp 98.1°F | Ht 69.5 in | Wt 194.0 lb

## 2013-03-06 DIAGNOSIS — G459 Transient cerebral ischemic attack, unspecified: Secondary | ICD-10-CM

## 2013-03-06 NOTE — Patient Instructions (Signed)
Continue aspirin 81 mg orally every day for secondary stroke prevention and maintain strict control of hypertension with blood pressure goal below 130/80, diabetes with hemoglobin A1c goal below 6.5% and lipids with LDL cholesterol goal below 70 mg/dL.   Requested lipid panel results to be faxed to our office. Lipid panel to be checked yearly.  Continue weight loss and healthy diet.  Followup in 6 months.

## 2013-03-06 NOTE — Progress Notes (Signed)
PATIENT: Jill Foley DOB: 06/01/1965   REASON FOR VISIT: follow up for TIA, 03/2012 HISTORY FROM: patient  HISTORY OF PRESENT ILLNESS: Patient is 47 y/o female with a history signigicant for DMII, borderline HTN, hyperlipidemia, and family history of a mother who had a CVA at age 46, who had a TIA on 10/20/11. Patient states she was in the shower when she felt tingling in her right ear, right arm, and right leg. She also began to feel a little light headed. Denies headache or vision changes. She was worked up at [facility] for TIA. 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 The FLAIRr images showed several areas of tiny nonspecific subcortical and periventricular white matter hyperintensities which raised concern for possible demyelinating disease. Echocardiogram was unremarkable. Patient was started on daily aspirin. She does have vascular risk factors of diabetes, hypertension, and hyperlipidemia. 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 She has undergone multiple surgeries mostly in the right eye which she is legally blind in.   09/04/12 (LL):  Returned since last visit on 03/25/2012. She has been doing well no new paresthesias or recurrent TIA/stroke symptoms. Continues to take aspirin 81 mg daily without significant bleeding for problems. Her most recent hemoglobin A1c was 6.6 which was done at PCP in May of this year. Patient states that her blood pressure is well controlled and usually in the 120s over 80s when she takes it at home. She reports that her lipid panel showed mildly elevated cholesterol, she will try to get the lab results and for them to our office. She was told by her primary doctor's office that her kidney function is slightly worse and that she is now chronic kidney disease stage III. She has concerns that if his worse since recently starting on Januvia.  She continues to exercise regularly and is trying to lose weight.   03/06/13 (LL):  Jill Foley returns for follow up for TIA that occurred approximately 1 year ago.  She is doing well, satying active.  Her BP is well controlled, is 107/60 in office today.  She states her cholesterol numbers were good at last check and her last Hgba1c was 7.0.  She is taking daily aspirin without significant bruising.  She has no complaints.  REVIEW OF SYSTEMS: Full 14 system review of systems performed and notable only for: (nothing).  ALLERGIES: Allergies  Allergen Reactions  . Fish Allergy Itching  . Ivp Dye [Iodinated Diagnostic Agents] Itching    HOME MEDICATIONS: Outpatient Prescriptions Prior to Visit  Medication Sig Dispense Refill  . aspirin 81 MG chewable tablet Chew 81 mg by mouth daily.      Marland Kitchen BIOTIN PO Take 1 tablet by mouth daily.      . cholecalciferol (VITAMIN D) 1000 UNITS tablet Take 1,000 Units by mouth daily.       Marland Kitchen lisinopril-hydrochlorothiazide (PRINZIDE,ZESTORETIC) 20-12.5 MG per tablet Take 1 tablet by mouth daily.      . Magnesium Oxide 250 MG TABS Take 1 tablet by mouth daily.      . metFORMIN (GLUCOPHAGE) 500 MG tablet Take 500 mg by mouth 2 (two) times daily with a meal.      . methylcellulose (ARTIFICIAL TEARS) 1 % ophthalmic solution Place 1 drop into both eyes 2 (two) times daily.      . Multiple Vitamin (MULTIVITAMIN) capsule Take 1 capsule  by mouth daily.      . pantoprazole (PROTONIX) 20 MG tablet Take 20 mg by mouth 2 (two) times daily.      . pravastatin (PRAVACHOL) 40 MG tablet Take 40 mg by mouth daily.      Marland Kitchen gabapentin (NEURONTIN) 100 MG capsule Take 100 mg by mouth 3 (three) times daily.      . sitaGLIPtin (JANUVIA) 25 MG tablet Take 25 mg by mouth daily.       No facility-administered medications prior to visit.    PAST MEDICAL HISTORY: Past Medical History  Diagnosis Date  . Diabetes mellitus   . Hypertension   . Hypercholesteremia   . Seasonal allergies    . Sinus infection   . Achalasia   . Seizures     PAST SURGICAL HISTORY: Past Surgical History  Procedure Laterality Date  . Skin biopsy    . Eye surgery  2000    cataract removal with lens implants, both eyes  . Breast surgery Bilateral     Biopsy,LFT 2000,Right 2008  . Vitrectomy  lft  1999, [id number]    lft  1999, Right2002    FAMILY HISTORY: Family History  Problem Relation Age of Onset  . Hypertension Mother   . Diabetes Mother   . Heart Problems Mother   . Hypertension Father   . Glaucoma Father   . Sudden death Neg Hx   . Heart attack Neg Hx   . Diabetes Other   . Hypertension Other   . Hyperlipidemia Other     SOCIAL HISTORY: History   Social History  . Marital Status: Single    Spouse Name: N/A    Number of Children: 0  . Years of Education: BS   Occupational History  . retired    Social History Main Topics  . Smoking status: Never Smoker   . Smokeless tobacco: Never Used  . Alcohol Use: No  . Drug Use: No  . Sexual Activity: Not on file   Other Topics Concern  . Not on file   Social History Narrative   Patient lives at home alone.   Caffeine Use: occasionally     PHYSICAL EXAM  Filed Vitals:   03/06/13 1319  BP: 107/60  Pulse: 95  Temp: 98.1 F (36.7 C)  TempSrc: Oral  Height: 5' 9.5" (1.765 m)  Weight: 194 lb (87.998 kg)   Body mass index is 28.25 kg/(m^2).  GENERAL EXAM: Patient is in no distress, well developed and well groomed.  HEAD: Symmetric facial features.  EARS, NOSE, and THROAT: Normal.  NECK: Supple, no JVD  RESPIRATORY: Lungs CTA.  CARDIOVASCULAR: Regular rate and rhythm, no murmurs, no carotid bruits  SKIN: No rash, no bruising   NEUROLOGIC:  MENTAL STATUS: awake, alert and oriented to person, place and time, language fluent, comprehension intact, naming intact  CRANIAL NERVE: Vision acuity is limited in both eyes with legal blindness in the right eye with only recognizing shapes. no nystagmus, facial  sensation and strength symmetric, uvula midline, shoulder shrug symmetric, tongue midline. Hearing is normal. ABSENT PERIPHERAL VISION IN BOTH EYES.  MOTOR: normal bulk and tone, full strength in the BUE, BLE,fine finger movements normal  SENSORY: normal and symmetric to light touch, pinprick, temperature, vibration  COORDINATION: finger-nose-finger  REFLEXES: deep tendon reflexes present and symmetric 2+,  GAIT/STATION: narrow based gait; able to walk on toes, heels, tandem is unsteady. romberg is negative. No assistive device.   DIAGNOSTIC DATA (LABS, IMAGING, TESTING) - I reviewed  patient records, labs, notes, testing and imaging myself where available.  CT Head 10/20/11: unremarkable.  EKG 10/20/11: unremarkable.  Carotid doppler 10/20/11: No significant extracranial carotid artery stenosis demonstrated. Vertebrals are patent with antegrade flow.  Cardiac echo 10/21/11: The estimated ejection fraction was in the range of 55% to 60%. Unremarkable.  MRI HEAD WITHOUT CONTRAST 10/20/11:  1. Punctate area of diffusion signal abnormality in the left dorsal brain stem. I do not favor this is an acute lacunar  infarct, but instead favor either artifact or tiny demyelinating lesion - see #2.  2. Scattered cerebral white matter signal changes which are advanced for age. These are nonspecific, but in light of symptoms consider demyelinating disease.  3. No other acute intracranial abnormality. Postoperative changes to the orbits.  MRA HEAD WITHOUT CONTRAST 10/20/11:  1. Cavernous ICA atherosclerosis without hemodynamically significant stenosis.  2. Otherwise negative intracranial MRA (incidental anatomic variants as noted above).    ASSESSMENT AND PLAN  47 year old [place] American female with transient episode of right body paresthesias of unclear etiology on 10/14/2011 thought to be a left brain TIA secondary to small vessel disease from diabetes, hypertension, hyperlipidemia with normal workup for  demyelinating disease. Patient is now chronic kidney disease stage III, recommended to her that she get established with a nephrologist. No further TIA or stroke-like symptoms.  Continue aspirin 81 mg orally every day for secondary stroke prevention and maintain strict control of hypertension with blood pressure goal below 130/80, diabetes with hemoglobin A1c goal below 6.5% and lipids with LDL cholesterol goal below 70 mg/dL.   Requested lipid panel results to be faxed to our office. Lipid panel to be checked yearly.  Continue weight loss and healthy diet.  Patient was referred for the [facility] Pharmacogenetic Testing study. Followup in 6-12 months.   Ronal Fear, MSN, NP-C 03/06/2013, 1:33 PM [facility] [address] [place], [place] [address] [phone]  Note: This document was prepared with digital dictation and possible smart phrase technology. Any transcriptional errors that result from this process are unintentional.

## 2013-04-18 ENCOUNTER — Encounter: Payer: Self-pay | Admitting: Nurse Practitioner

## 2013-08-13 ENCOUNTER — Other Ambulatory Visit: Payer: Self-pay

## 2013-08-13 DIAGNOSIS — Z1231 Encounter for screening mammogram for malignant neoplasm of breast: Secondary | ICD-10-CM

## 2013-09-08 ENCOUNTER — Encounter (INDEPENDENT_AMBULATORY_CARE_PROVIDER_SITE_OTHER): Payer: Self-pay

## 2013-09-08 ENCOUNTER — Ambulatory Visit
Admission: RE | Admit: 2013-09-08 | Discharge: 2013-09-08 | Disposition: A | Discharge status: Home / Self Care | Payer: Medicare Other | Source: Ambulatory Visit

## 2013-09-08 DIAGNOSIS — Z1231 Encounter for screening mammogram for malignant neoplasm of breast: Secondary | ICD-10-CM

## 2013-09-16 IMAGING — CT CT HEAD W/O CM
1 series · 16 of 30 positions shown, 20 images · non-contrast
Comparison: None.

CLINICAL DATA: Bowing of the right side

CT HEAD WITHOUT CONTRAST
TECHNIQUE: Contiguous axial images were obtained from the base of
the skull through the vertex without contrast.

[Series 2: head 4.8 h37s · axial · 0.46mm/px · z∈[-32,+105]mm · 16 of 32 slices shown, 20 images]
[im 2/32  brain]
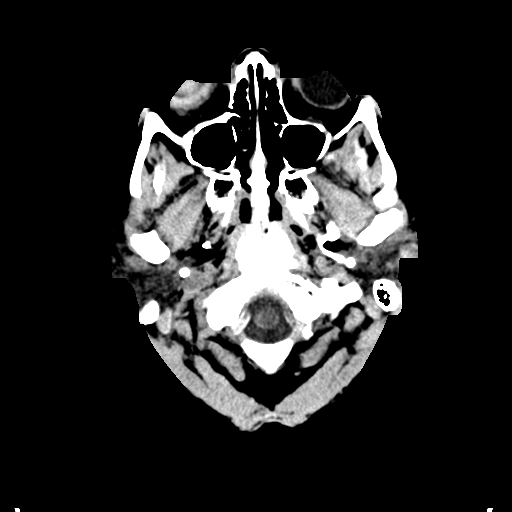
[im 2/32  bone]
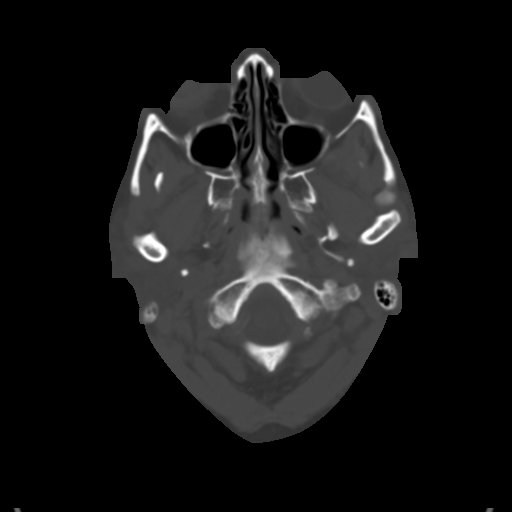
[im 4/32  brain]
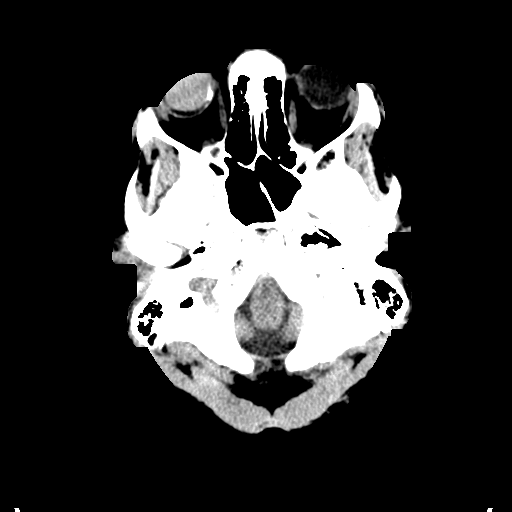
[im 6/32  brain]
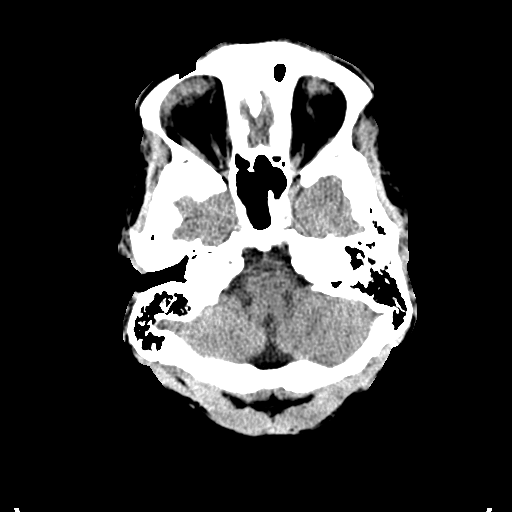
[im 8/32  brain]
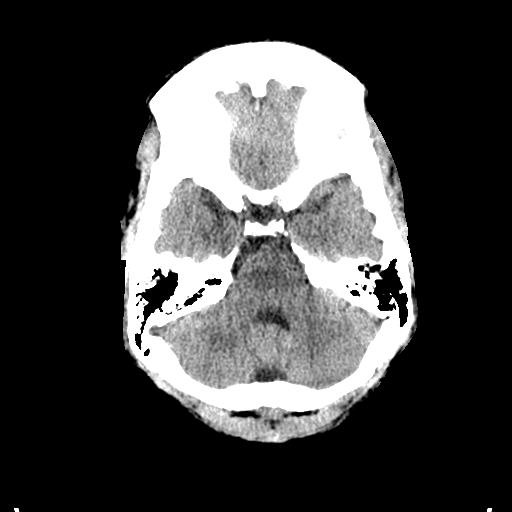
[im 9/32  brain]
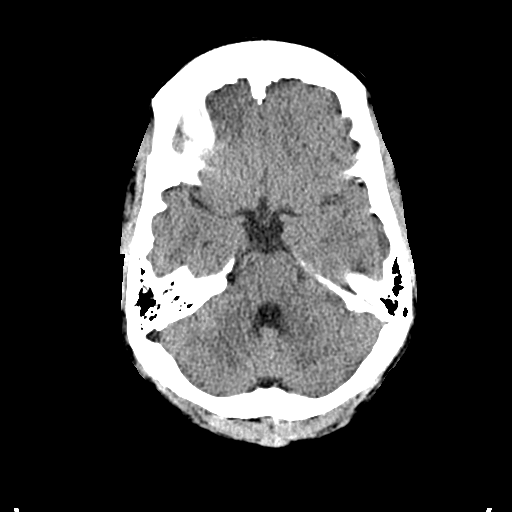
[im 9/32  bone]
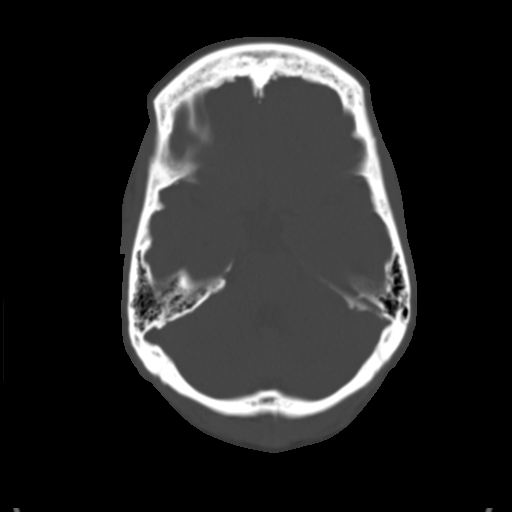
[im 11/32  brain]
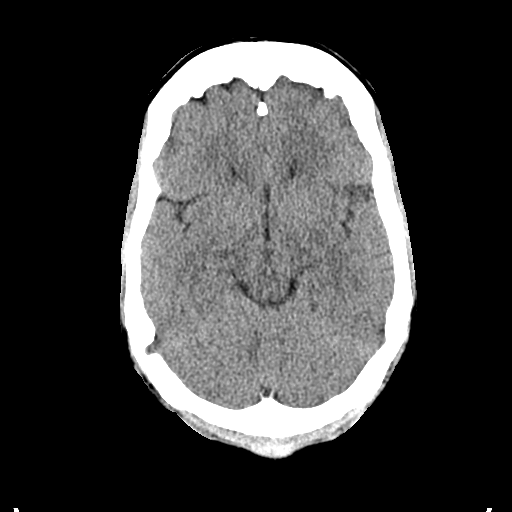
[im 13/32  brain]
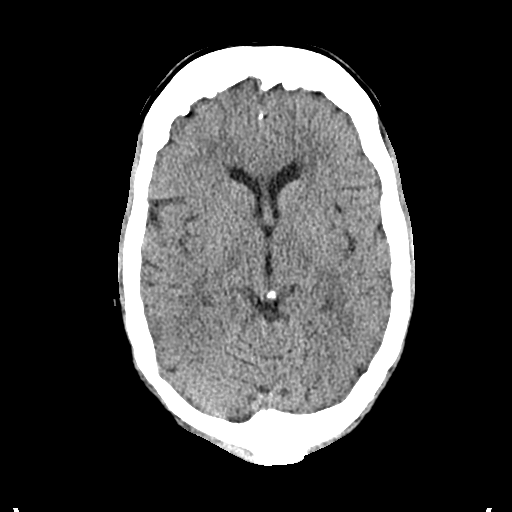
[im 15/32  brain]
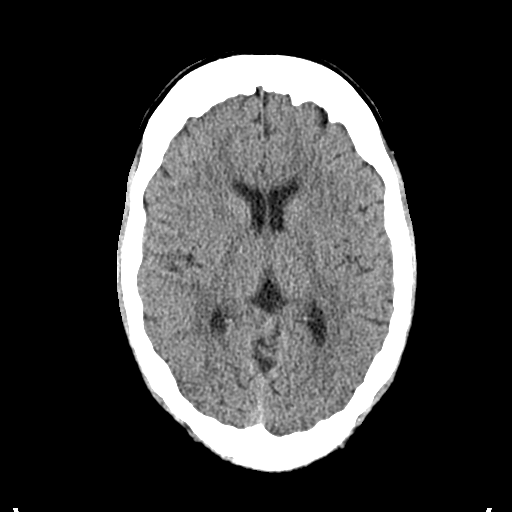
[im 17/32  brain]
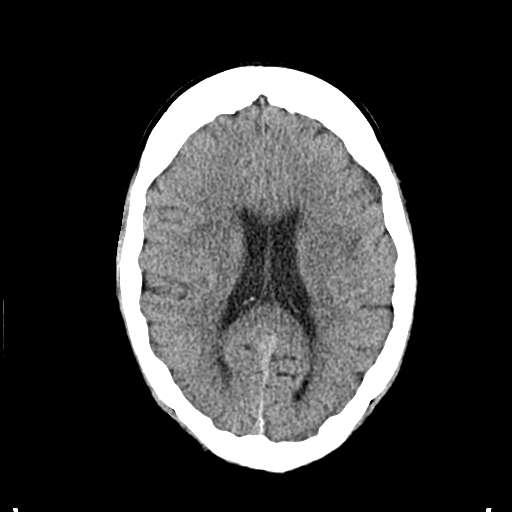
[im 17/32  bone]
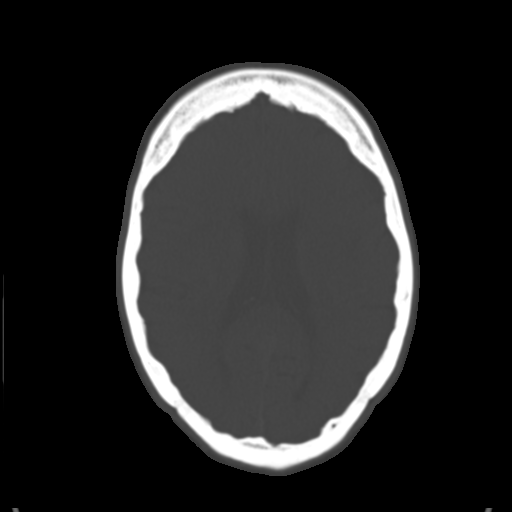
[im 19/32  brain]
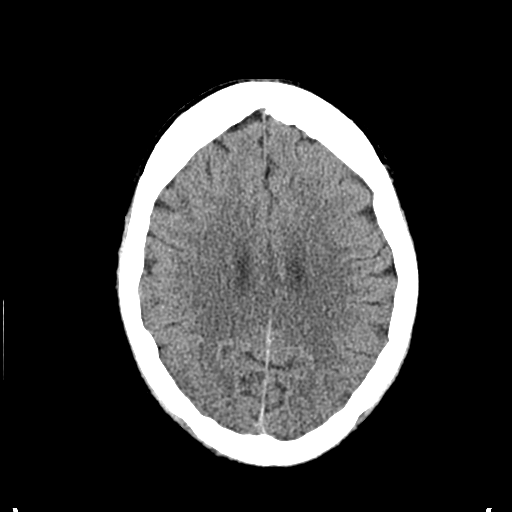
[im 21/32  brain]
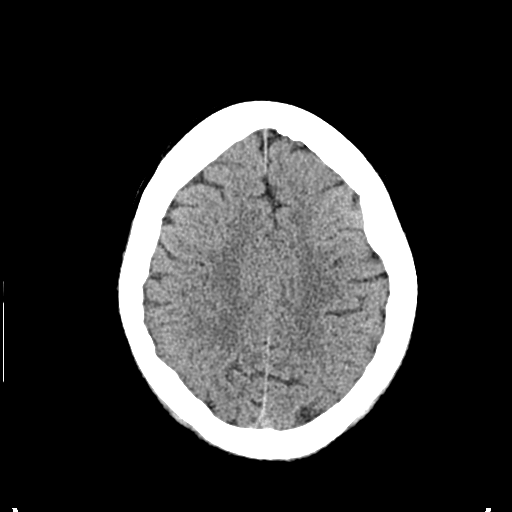
[im 23/32  brain]
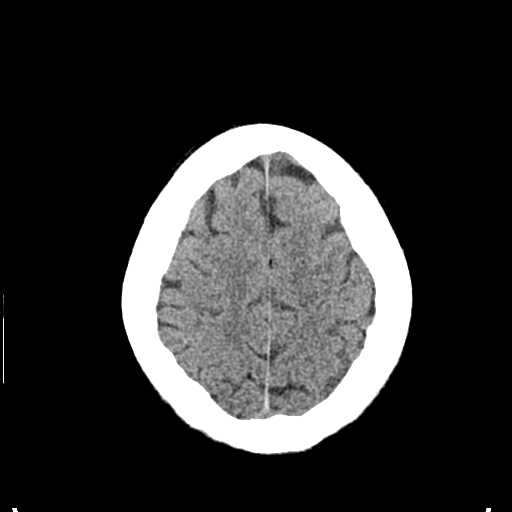
[im 24/32  brain]
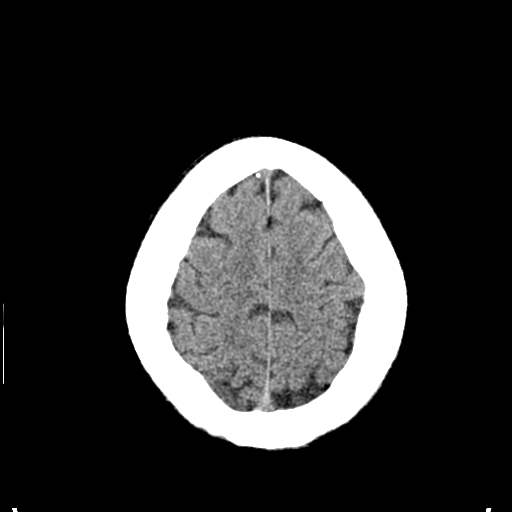
[im 24/32  bone]
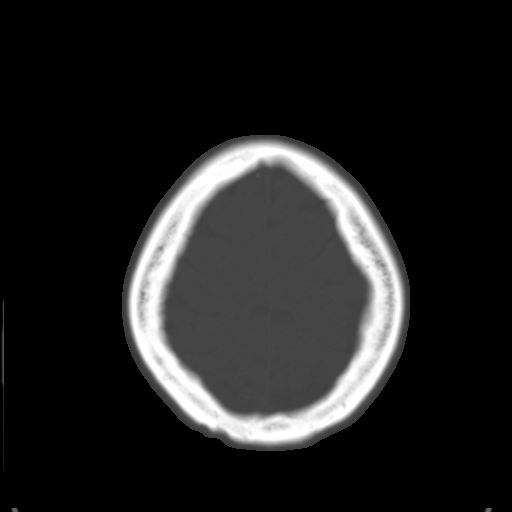
[im 26/32  brain]
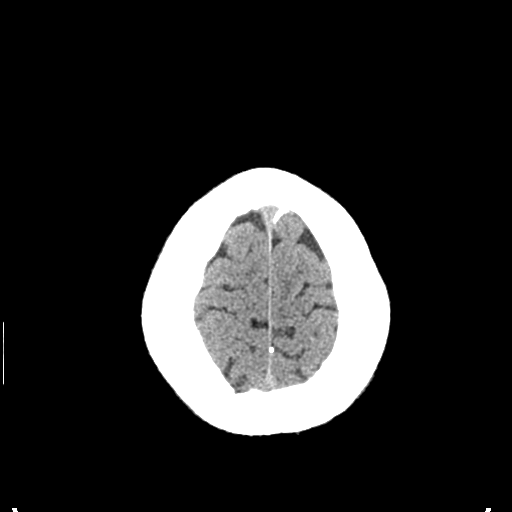
[im 28/32  brain]
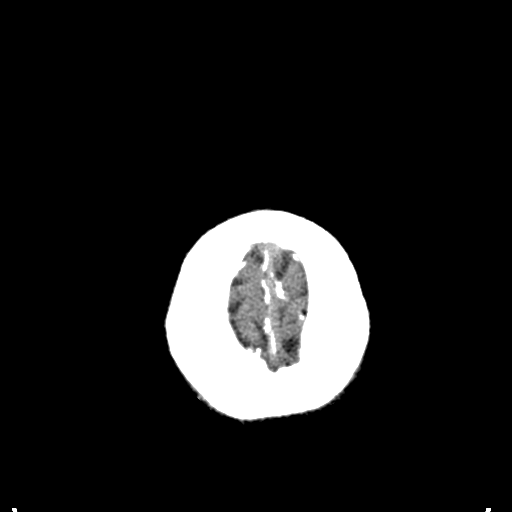
[im 30/32  brain]
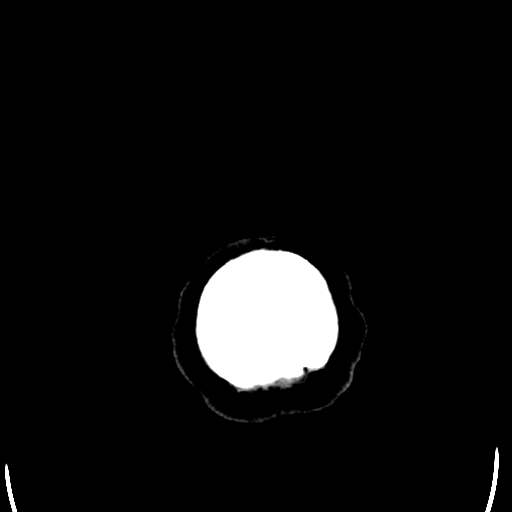

[16 of 30 positions shown; findings below may reference images not displayed]

FINDINGS: The brain has a normal appearance without evidence of
malformation, atrophy, old or acute infarction, mass lesion,
hemorrhage, hydrocephalus or extra-axial collection.  The calvarium
is unremarkable.  Sinuses, middle ears and mastoids are clear.
IMPRESSION: Normal head CT

## 2014-03-09 ENCOUNTER — Ambulatory Visit: Payer: Medicare Other | Admitting: Nurse Practitioner

## 2014-05-07 ENCOUNTER — Ambulatory Visit (INDEPENDENT_AMBULATORY_CARE_PROVIDER_SITE_OTHER): Payer: 59 | Admitting: Neurology

## 2014-05-07 ENCOUNTER — Encounter: Payer: Self-pay | Admitting: Neurology

## 2014-05-07 VITALS — BP 103/62 | HR 84 | Ht 69.5 in | Wt 193.0 lb

## 2014-05-07 DIAGNOSIS — G44209 Tension-type headache, unspecified, not intractable: Secondary | ICD-10-CM | POA: Diagnosis not present

## 2014-05-07 NOTE — Patient Instructions (Signed)
I had a long d/w patient about her remote TIA, risk for recurrent stroke/TIAs, personally independently reviewed imaging studies and stroke evaluation results and answered questions.Continue aspirin 81 mg orally every day  for secondary stroke prevention and maintain strict control of hypertension with blood pressure goal below 130/90, diabetes with hemoglobin A1c goal below 6.5% and lipids with LDL cholesterol goal below 100 mg/dL. I also advised the patient to eat a healthy diet with plenty of whole grains, cereals, fruits and vegetables, exercise regularly and maintain ideal body weight. I asked her to ask her primary MD to forward me copies of recent CT scan and carotid [facility]pp[facility] for my review. Followup in the future with me in 1 year or call earlier if necessary. Tension Headache A tension headache is a feeling of pain, pressure, or aching often felt over the front and sides of the head. The pain can be dull or can feel tight (constricting). It is the most common type of headache. Tension headaches are not normally associated with nausea or vomiting and do not get worse with physical activity. Tension headaches can last 30 minutes to several days.  CAUSES  The exact cause is not known, but it may be caused by chemicals and hormones in the brain that lead to pain. Tension headaches often begin after stress, anxiety, or depression. Other triggers may include:  Alcohol.  Caffeine (too much or withdrawal).  Respiratory infections (colds, flu, sinus infections).  Dental problems or teeth clenching.  Fatigue.  Holding your head and neck in one position too long while using a computer. SYMPTOMS   Pressure around the head.   Dull, aching head pain.   Pain felt over the front and sides of the head.   Tenderness in the muscles of the head, neck, and shoulders. DIAGNOSIS  A tension headache is often diagnosed based on:   Symptoms.   Physical examination.    A CT scan or MRI of your head. These tests may be ordered if symptoms are severe or unusual. TREATMENT  Medicines may be given to help relieve symptoms.  HOME CARE INSTRUCTIONS   Only take over-the-counter or prescription medicines for pain or discomfort as directed by your caregiver.   Lie down in a dark, quiet room when you have a headache.   Keep a journal to find out what may be triggering your headaches. For example, write down:  What you eat and drink.  How much sleep you get.  Any change to your diet or medicines.  Try massage or other relaxation techniques.   Ice packs or heat applied to the head and neck can be used. Use these 3 to 4 times per day for 15 to 20 minutes each time, or as needed.   Limit stress.   Sit up straight, and do not tense your muscles.   Quit smoking if you smoke.  Limit alcohol use.  Decrease the amount of caffeine you drink, or stop drinking caffeine.  Eat and exercise regularly.  Get 7 to 9 hours of sleep, or as recommended by your caregiver.  Avoid excessive use of pain medicine as recurrent headaches can occur.  SEEK MEDICAL CARE IF:   You have problems with the medicines you were prescribed.  Your medicines do not work.  You have a change from the usual headache.  You have nausea or vomiting. SEEK IMMEDIATE MEDICAL CARE IF:   Your headache becomes severe.  You have a fever.  You  have a stiff neck.  You have loss of vision.  You have muscular weakness or loss of muscle control.  You lose your balance or have trouble walking.  You feel faint or pass out.  You have severe symptoms that are different from your first symptoms. MAKE SURE YOU:   Understand these instructions.  Will watch your condition.  Will get help right away if you are not doing well or get worse. Document Released: 03/20/2005 Document Revised: 06/12/2011 Document Reviewed: 03/10/2011 [facility] Patient Information 2015 [place], [place].  This information is not intended to replace advice given to you by your health care provider. Make sure you discuss any questions you have with your health care provider.
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
## 2015-01-06 ENCOUNTER — Other Ambulatory Visit: Payer: Self-pay | Admitting: Obstetrics and Gynecology

## 2015-01-06 DIAGNOSIS — R928 Other abnormal and inconclusive findings on diagnostic imaging of breast: Secondary | ICD-10-CM

## 2015-01-12 ENCOUNTER — Ambulatory Visit
Admission: RE | Admit: 2015-01-12 | Discharge: 2015-01-12 | Disposition: A | Discharge status: Home / Self Care | Payer: Medicare Other | Source: Ambulatory Visit | Attending: Obstetrics and Gynecology | Admitting: Obstetrics and Gynecology

## 2015-01-12 DIAGNOSIS — R928 Other abnormal and inconclusive findings on diagnostic imaging of breast: Secondary | ICD-10-CM

## 2015-05-17 ENCOUNTER — Ambulatory Visit (INDEPENDENT_AMBULATORY_CARE_PROVIDER_SITE_OTHER): Payer: Medicare Other | Admitting: Neurology

## 2015-05-17 ENCOUNTER — Encounter: Payer: Self-pay | Admitting: Neurology

## 2015-05-17 VITALS — BP 128/71 | HR 84 | Ht 69.5 in | Wt 204.2 lb

## 2015-05-17 DIAGNOSIS — I6990 Unspecified sequelae of unspecified cerebrovascular disease: Secondary | ICD-10-CM
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
## 2015-05-17 NOTE — Patient Instructions (Signed)
I had a long discussion with the patient with regards to her remote TIA as well as tension headaches both of which appear to be quite stable. I recommend she discontinue Plavix and stay on aspirin 81 mg alone for stroke prevention as I do not see any benefit of dual antiplatelet therapy in this patient who is quite stable from neurovascular standpoint. She will maintain strict control of diabetes with hemoglobin A1c goal below 6.5, lipids with LDL cholesterol goal below 70 mg percent and hypertension with blood pressure goal below 130/90. She will return for follow-up in the future in a year with Butch Penny, nurse practitioner call earlier if necessary.

## 2015-07-22 DIAGNOSIS — E1140 Type 2 diabetes mellitus with diabetic neuropathy, unspecified: Secondary | ICD-10-CM | POA: Insufficient documentation

## 2015-07-22 DIAGNOSIS — M7741 Metatarsalgia, right foot: Secondary | ICD-10-CM | POA: Insufficient documentation

## 2015-09-07 ENCOUNTER — Other Ambulatory Visit: Payer: Self-pay | Admitting: General Surgery

## 2015-09-07 ENCOUNTER — Encounter (HOSPITAL_BASED_OUTPATIENT_CLINIC_OR_DEPARTMENT_OTHER): Payer: Self-pay | Admitting: *Deleted

## 2015-09-08 ENCOUNTER — Encounter (HOSPITAL_BASED_OUTPATIENT_CLINIC_OR_DEPARTMENT_OTHER)
Admission: RE | Admit: 2015-09-08 | Discharge: 2015-09-08 | Disposition: A | Discharge status: Home / Self Care | Payer: Medicare Other | Source: Ambulatory Visit | Attending: General Surgery | Admitting: General Surgery

## 2015-09-08 DIAGNOSIS — I10 Essential (primary) hypertension: Secondary | ICD-10-CM | POA: Diagnosis not present

## 2015-09-08 DIAGNOSIS — Z79899 Other long term (current) drug therapy: Secondary | ICD-10-CM | POA: Diagnosis not present

## 2015-09-08 DIAGNOSIS — L723 Sebaceous cyst: Secondary | ICD-10-CM | POA: Diagnosis present

## 2015-09-08 DIAGNOSIS — Z7984 Long term (current) use of oral hypoglycemic drugs: Secondary | ICD-10-CM | POA: Diagnosis not present

## 2015-09-08 DIAGNOSIS — Z7982 Long term (current) use of aspirin: Secondary | ICD-10-CM | POA: Diagnosis not present

## 2015-09-08 DIAGNOSIS — E119 Type 2 diabetes mellitus without complications: Secondary | ICD-10-CM | POA: Diagnosis not present

## 2015-09-08 LAB — BASIC METABOLIC PANEL
ANION GAP: 7 (ref 5–15)
BUN: 21 mg/dL — ABNORMAL HIGH (ref 6–20)
CALCIUM: 9.7 mg/dL (ref 8.9–10.3)
CO2: 30 mmol/L (ref 22–32)
Chloride: 104 mmol/L (ref 101–111)
Creatinine, Ser: 1.27 mg/dL — ABNORMAL HIGH (ref 0.44–1.00)
GFR calc Af Amer: 56 mL/min — ABNORMAL LOW (ref >60)
GFR calc non Af Amer: 48 mL/min — ABNORMAL LOW (ref >60)
GLUCOSE: 91 mg/dL (ref 65–99)
POTASSIUM: 4.4 mmol/L (ref 3.5–5.1)
Sodium: 141 mmol/L (ref 135–145)

## 2015-09-10 ENCOUNTER — Ambulatory Visit (HOSPITAL_BASED_OUTPATIENT_CLINIC_OR_DEPARTMENT_OTHER)
Admission: RE | Admit: 2015-09-10 | Discharge: 2015-09-10 | Disposition: A | Discharge status: Home / Self Care | Payer: Medicare Other | Source: Ambulatory Visit | Attending: General Surgery | Admitting: General Surgery

## 2015-09-10 ENCOUNTER — Ambulatory Visit (HOSPITAL_BASED_OUTPATIENT_CLINIC_OR_DEPARTMENT_OTHER): Payer: Medicare Other | Admitting: Certified Registered"

## 2015-09-10 ENCOUNTER — Encounter (HOSPITAL_BASED_OUTPATIENT_CLINIC_OR_DEPARTMENT_OTHER)
Admission: RE | Disposition: A | Discharge status: Home / Self Care | Payer: Self-pay | Source: Ambulatory Visit | Attending: General Surgery

## 2015-09-10 ENCOUNTER — Encounter (HOSPITAL_BASED_OUTPATIENT_CLINIC_OR_DEPARTMENT_OTHER): Payer: Self-pay | Admitting: Certified Registered"

## 2015-09-10 DIAGNOSIS — L723 Sebaceous cyst: Secondary | ICD-10-CM | POA: Insufficient documentation

## 2015-09-10 DIAGNOSIS — E119 Type 2 diabetes mellitus without complications: Secondary | ICD-10-CM | POA: Insufficient documentation

## 2015-09-10 DIAGNOSIS — Z7982 Long term (current) use of aspirin: Secondary | ICD-10-CM | POA: Insufficient documentation

## 2015-09-10 DIAGNOSIS — I10 Essential (primary) hypertension: Secondary | ICD-10-CM | POA: Diagnosis not present

## 2015-09-10 DIAGNOSIS — Z79899 Other long term (current) drug therapy: Secondary | ICD-10-CM | POA: Insufficient documentation

## 2015-09-10 DIAGNOSIS — Z7984 Long term (current) use of oral hypoglycemic drugs: Secondary | ICD-10-CM | POA: Insufficient documentation

## 2015-09-10 HISTORY — DX: Anemia, unspecified: D64.9

## 2015-09-10 HISTORY — DX: Legal blindness, as defined in USA: H54.8

## 2015-09-10 HISTORY — PX: BREAST BIOPSY: SHX20

## 2015-09-10 LAB — GLUCOSE, CAPILLARY
Glucose-Capillary: 82 mg/dL (ref 65–99)
Glucose-Capillary: 69 mg/dL (ref 65–99)

## 2015-09-10 SURGERY — BREAST BIOPSY
Anesthesia: General | Site: Breast | Laterality: Left

## 2015-09-10 MED ORDER — HYDROMORPHONE HCL 1 MG/ML IJ SOLN
INTRAMUSCULAR | Status: AC
  Filled 2015-09-10: qty 1

## 2015-09-10 MED ORDER — CHLORHEXIDINE GLUCONATE 4 % EX LIQD: 1.0000 "application " | Freq: Once | CUTANEOUS | Status: DC

## 2015-09-10 MED ORDER — MEPERIDINE HCL 25 MG/ML IJ SOLN: 6.2500 mg | INTRAMUSCULAR | Status: DC | PRN

## 2015-09-10 MED ORDER — BUPIVACAINE HCL (PF) 0.25 % IJ SOLN
INTRAMUSCULAR | Status: AC
  Filled 2015-09-10: qty 30

## 2015-09-10 MED ORDER — FENTANYL CITRATE (PF) 100 MCG/2ML IJ SOLN
50.0000 ug | INTRAMUSCULAR | Status: DC | PRN
  Administered 2015-09-10: 100 ug via INTRAVENOUS

## 2015-09-10 MED ORDER — FENTANYL CITRATE (PF) 100 MCG/2ML IJ SOLN
INTRAMUSCULAR | Status: AC
  Filled 2015-09-10: qty 2

## 2015-09-10 MED ORDER — DOXYCYCLINE HYCLATE 100 MG PO TABS: 100.0000 mg | ORAL_TABLET | Freq: Two times a day (BID) | ORAL | Status: DC

## 2015-09-10 MED ORDER — ONDANSETRON HCL 4 MG/2ML IJ SOLN: 4.0000 mg | Freq: Once | INTRAMUSCULAR | Status: DC | PRN

## 2015-09-10 MED ORDER — PROPOFOL 10 MG/ML IV BOLUS
INTRAVENOUS | Status: DC | PRN
  Administered 2015-09-10: 160 mg via INTRAVENOUS

## 2015-09-10 MED ORDER — CEFAZOLIN SODIUM-DEXTROSE 2-4 GM/100ML-% IV SOLN
INTRAVENOUS | Status: AC
  Filled 2015-09-10: qty 100

## 2015-09-10 MED ORDER — BUPIVACAINE HCL (PF) 0.25 % IJ SOLN
INTRAMUSCULAR | Status: DC | PRN
Start: 2015-09-10 — End: 2015-09-10
  Administered 2015-09-10: 10 mL

## 2015-09-10 MED ORDER — DEXAMETHASONE SODIUM PHOSPHATE 10 MG/ML IJ SOLN
INTRAMUSCULAR | Status: AC
  Filled 2015-09-10: qty 2

## 2015-09-10 MED ORDER — DEXAMETHASONE SODIUM PHOSPHATE 4 MG/ML IJ SOLN
INTRAMUSCULAR | Status: DC | PRN
  Administered 2015-09-10: 4 mg via INTRAVENOUS

## 2015-09-10 MED ORDER — HYDROMORPHONE HCL 1 MG/ML IJ SOLN
0.2500 mg | INTRAMUSCULAR | Status: DC | PRN
  Administered 2015-09-10: 0.5 mg via INTRAVENOUS

## 2015-09-10 MED ORDER — GLYCOPYRROLATE 0.2 MG/ML IJ SOLN: 0.2000 mg | Freq: Once | INTRAMUSCULAR | Status: DC | PRN

## 2015-09-10 MED ORDER — SCOPOLAMINE 1 MG/3DAYS TD PT72: 1.0000 | MEDICATED_PATCH | Freq: Once | TRANSDERMAL | Status: DC | PRN

## 2015-09-10 MED ORDER — LIDOCAINE HCL (CARDIAC) 20 MG/ML IV SOLN
INTRAVENOUS | Status: DC | PRN
  Administered 2015-09-10: 60 mg via INTRAVENOUS

## 2015-09-10 MED ORDER — ONDANSETRON HCL 4 MG/2ML IJ SOLN
INTRAMUSCULAR | Status: DC | PRN
  Administered 2015-09-10: 4 mg via INTRAVENOUS

## 2015-09-10 MED ORDER — OXYCODONE-ACETAMINOPHEN 5-325 MG PO TABS: 1.0000 | ORAL_TABLET | ORAL | Status: DC | PRN

## 2015-09-10 MED ORDER — LACTATED RINGERS IV SOLN
INTRAVENOUS | Status: DC
  Administered 2015-09-10 (×2): via INTRAVENOUS
  Administered 2015-09-10: 10 mL/h via INTRAVENOUS

## 2015-09-10 MED ORDER — PHENYLEPHRINE HCL 10 MG/ML IJ SOLN
INTRAMUSCULAR | Status: DC | PRN
  Administered 2015-09-10 (×2): 40 ug via INTRAVENOUS
  Administered 2015-09-10: 80 ug via INTRAVENOUS
  Administered 2015-09-10: 40 ug via INTRAVENOUS

## 2015-09-10 MED ORDER — PROPOFOL 10 MG/ML IV BOLUS
INTRAVENOUS | Status: AC
  Filled 2015-09-10: qty 20

## 2015-09-10 MED ORDER — MIDAZOLAM HCL 2 MG/2ML IJ SOLN: 1.0000 mg | INTRAMUSCULAR | Status: DC | PRN

## 2015-09-10 MED ORDER — CEFAZOLIN SODIUM-DEXTROSE 2-4 GM/100ML-% IV SOLN
2.0000 g | INTRAVENOUS | Status: AC
  Administered 2015-09-10: 2 g via INTRAVENOUS

## 2015-09-10 MED ORDER — 0.9 % SODIUM CHLORIDE (POUR BTL) OPTIME
TOPICAL | Status: DC | PRN
  Administered 2015-09-10: 1000 mL

## 2015-09-10 SURGICAL SUPPLY — 37 items
BLADE SURG 15 STRL LF DISP TIS (BLADE) ×1 IMPLANT
BLADE SURG 15 STRL SS (BLADE) ×3
CANISTER SUCT 1200ML W/VALVE (MISCELLANEOUS) ×3 IMPLANT
CHLORAPREP W/TINT 26ML (MISCELLANEOUS) ×3 IMPLANT
CLIP TI WIDE RED SMALL 6 (CLIP) IMPLANT
COVER BACK TABLE 60X90IN (DRAPES) ×3 IMPLANT
COVER MAYO STAND STRL (DRAPES) ×3 IMPLANT
DECANTER SPIKE VIAL GLASS SM (MISCELLANEOUS) IMPLANT
DEVICE DUBIN W/COMP PLATE 8390 (MISCELLANEOUS) IMPLANT
DRAPE LAPAROSCOPIC ABDOMINAL (DRAPES) ×3 IMPLANT
DRAPE UTILITY XL STRL (DRAPES) ×3 IMPLANT
ELECT COATED BLADE 2.86 ST (ELECTRODE) ×3 IMPLANT
ELECT REM PT RETURN 9FT ADLT (ELECTROSURGICAL) ×3
ELECTRODE REM PT RTRN 9FT ADLT (ELECTROSURGICAL) ×1 IMPLANT
GLOVE BIO SURGEON STRL SZ7.5 (GLOVE) ×3 IMPLANT
GLOVE BIOGEL PI IND STRL 7.0 (GLOVE) IMPLANT
GLOVE BIOGEL PI INDICATOR 7.0 (GLOVE) ×2
GLOVE ECLIPSE 6.5 STRL STRAW (GLOVE) ×2 IMPLANT
GOWN STRL REUS W/ TWL LRG LVL3 (GOWN DISPOSABLE) ×2 IMPLANT
GOWN STRL REUS W/TWL LRG LVL3 (GOWN DISPOSABLE) ×6
LIQUID BAND (GAUZE/BANDAGES/DRESSINGS) ×3 IMPLANT
NDL HYPO 25X1 1.5 SAFETY (NEEDLE) ×1 IMPLANT
NEEDLE HYPO 25X1 1.5 SAFETY (NEEDLE) ×3 IMPLANT
NS IRRIG 1000ML POUR BTL (IV SOLUTION) ×3 IMPLANT
PACK BASIN DAY SURGERY FS (CUSTOM PROCEDURE TRAY) ×3 IMPLANT
PENCIL BUTTON HOLSTER BLD 10FT (ELECTRODE) ×3 IMPLANT
SLEEVE SCD COMPRESS KNEE MED (MISCELLANEOUS) ×3 IMPLANT
SPONGE LAP 18X18 X RAY DECT (DISPOSABLE) ×3 IMPLANT
SUT MON AB 4-0 PC3 18 (SUTURE) ×3 IMPLANT
SUT SILK 2 0 SH (SUTURE) ×3 IMPLANT
SUT VICRYL 3-0 CR8 SH (SUTURE) ×3 IMPLANT
SYR CONTROL 10ML LL (SYRINGE) ×3 IMPLANT
TOWEL OR 17X24 6PK STRL BLUE (TOWEL DISPOSABLE) ×3 IMPLANT
TOWEL OR NON WOVEN STRL DISP B (DISPOSABLE) ×3 IMPLANT
TUBE CONNECTING 20'X1/4 (TUBING) ×1
TUBE CONNECTING 20X1/4 (TUBING) ×2 IMPLANT
YANKAUER SUCT BULB TIP NO VENT (SUCTIONS) ×3 IMPLANT

## 2015-09-10 NOTE — Anesthesia Preprocedure Evaluation (Signed)
Anesthesia Evaluation  Patient identified by MRN, date of birth, ID band Patient awake    Reviewed: Allergy & Precautions, NPO status , Patient's Chart, lab work & pertinent test results  Airway Mallampati: I  TM Distance: >3 FB Neck ROM: Full    Dental   Pulmonary    Pulmonary exam normal        Cardiovascular hypertension, Pt. on medications Normal cardiovascular exam     Neuro/Psych TIACVA    GI/Hepatic   Endo/Other  diabetes, Type 2, Oral Hypoglycemic Agents  Renal/GU      Musculoskeletal   Abdominal   Peds  Hematology   Anesthesia Other Findings   Reproductive/Obstetrics                             Anesthesia Physical Anesthesia Plan  ASA: II  Anesthesia Plan: General   Post-op Pain Management:    Induction: Intravenous  Airway Management Planned: LMA  Additional Equipment:   Intra-op Plan:   Post-operative Plan: Extubation in OR  Informed Consent: I have reviewed the patients History and Physical, chart, labs and discussed the procedure including the risks, benefits and alternatives for the proposed anesthesia with the patient or authorized representative who has indicated his/her understanding and acceptance.     Plan Discussed with: CRNA and Surgeon  Anesthesia Plan Comments:         Anesthesia Quick Evaluation

## 2015-09-10 NOTE — Interval H&P Note (Signed)
History and Physical Interval Note:  09/10/2015 1:17 PM  Jill DerbyKimberly A Calabria  has presented today for surgery, with the diagnosis of sebaceous cyst left breast  The various methods of treatment have been discussed with the patient and family. After consideration of risks, benefits and other options for treatment, the patient has consented to  Procedure(s): EXCISION CYST LEFT BREAST (Left) as a surgical intervention .  The patient's history has been reviewed, patient examined, no change in status, stable for surgery.  I have reviewed the patient's chart and labs.  Questions were answered to the patient's satisfaction.     TOTH III,Fallynn Gravett S

## 2015-09-10 NOTE — H&P (Signed)
[facility]  Location: [facility] Patient #: [id number] DOB: [birth year] Single / Language: [place] / Race: Black or African American Female   History of Present Illness Patient words: br check.  The patient is a 50 year old female who presents for a follow-up for Breast mass. The patient is a 50 year old black female who was seen recently with what appeared to be an infected sebaceous cyst in the upper portion of the left breast. She was treated with antibiotics and the cellulitis has resolved. She still has the sebaceous cyst in the upper portion of the left breast.   Problem List/Past Medical SEBACEOUS CYST OF SKIN OF LEFT BREAST (N60.82)  Allergies  Iodine Strong (Lugol's) *CHEMICALS* FISH  Medication History  Doxycycline Hyclate (  Tablet, 1 (one) Tablet Oral two times daily, Taken starting 08/26/2015) Active. Lisinopril-Hydrochlorothiazide (20-12.5MG  Tablet, Oral) Active. MetFORMIN HCl (  Tablet, Oral) Active. Methylcellulose (1% Solution, Ophthalmic) Active. Pravastatin Sodium (  Tablet, Oral) Active. Aspirin (  Tablet, Oral) Active. Calcium (  Tablet, Oral) Active. Ferrous Sulfate (325 (65 Fe)MG Tablet, Oral) Active. Magnesium Oxide (  Tablet, Oral) Active. Multiple Vitamin (Oral) Active. Januvia (  Tablet, Oral) Active. Ketotifen Fumarate (0.025% Solution, Ophthalmic daily) Active. Medications Reconciled    Review of Systems  General Present- Night Sweats. Not Present- Appetite Loss, Chills, Fatigue, Fever, Weight Gain and Weight Loss. Skin Present- Change in Wart/Mole. Not Present- Dryness, Hives, Jaundice, New Lesions, Non-Healing Wounds, Rash and Ulcer. HEENT Present- Seasonal Allergies. Not Present- Earache, Hearing Loss, Hoarseness, Nose Bleed, Oral Ulcers, Ringing in the Ears, Sinus Pain, Sore Throat, Visual Disturbances, Wears glasses/contact lenses and Yellow Eyes. Respiratory Not Present- Bloody  sputum, Chronic Cough, Difficulty Breathing, Snoring and Wheezing. Cardiovascular Not Present- Chest Pain, Difficulty Breathing Lying Down, Leg Cramps, Palpitations, Rapid Heart Rate, Shortness of Breath and Swelling of Extremities. Gastrointestinal Not Present- Abdominal Pain, Bloating, Bloody Stool, Change in Bowel Habits, Chronic diarrhea, Constipation, Difficulty Swallowing, Excessive gas, Gets full quickly at meals, Hemorrhoids, Indigestion, Nausea, Rectal Pain and Vomiting. Female Genitourinary Not Present- Frequency, Nocturia, Painful Urination, Pelvic Pain and Urgency. Musculoskeletal Not Present- Back Pain, Joint Pain, Joint Stiffness, Muscle Pain, Muscle Weakness and Swelling of Extremities. Neurological Not Present- Decreased Memory, Fainting, Headaches, Numbness, Seizures, Tingling, Tremor, Trouble walking and Weakness. Psychiatric Not Present- Anxiety, Bipolar, Change in Sleep Pattern, Depression, Fearful and Frequent crying. Endocrine Present- Hot flashes. Not Present- Cold Intolerance, Excessive Hunger, Hair Changes, Heat Intolerance and New Diabetes. Hematology Not Present- Easy Bruising, Excessive bleeding, Gland problems, HIV and Persistent Infections.  Vitals  Weight: 200 lb Height: 62in Body Surface Area: 1.91 m Body Mass Index: 36.58 kg/m  BP: 110/60 (Sitting, Left Arm, Standard)       Physical Exam  General Mental Status-Alert. General Appearance-Consistent with stated age. Hydration-Well hydrated. Voice-Normal.  Head and Neck Head-normocephalic, atraumatic with no lesions or palpable masses. Trachea-midline. Thyroid Gland Characteristics - normal size and consistency.  Eye Eyeball - Bilateral-Extraocular movements intact. Sclera/Conjunctiva - Bilateral-No scleral icterus.  Chest and Lung Exam Chest and lung exam reveals -quiet, even and easy respiratory effort with no use of accessory muscles and on auscultation, normal breath  sounds, no adventitious sounds and normal vocal resonance. Inspection Chest Wall - Normal. Back - normal.  Breast Note: There is an approximate 2 cm sebaceous cyst in the upper portion of the left breast with some palpable fluid within it. The cellulitis has resolved.   Cardiovascular Cardiovascular examination reveals -normal heart sounds, regular rate and rhythm with no murmurs and normal  pedal pulses bilaterally.  Abdomen Inspection Inspection of the abdomen reveals - No Hernias. Skin - Scar - no surgical scars. Palpation/Percussion Palpation and Percussion of the abdomen reveal - Soft, Non Tender, No Rebound tenderness, No Rigidity (guarding) and No hepatosplenomegaly. Auscultation Auscultation of the abdomen reveals - Bowel sounds normal.  Neurologic Neurologic evaluation reveals -alert and oriented x 3 with no impairment of recent or remote memory. Mental Status-Normal.  Musculoskeletal Normal Exam - Left-Upper Extremity Strength Normal and Lower Extremity Strength Normal. Normal Exam - Right-Upper Extremity Strength Normal and Lower Extremity Strength Normal.  Lymphatic Head & Neck  General Head & Neck Lymphatics: Bilateral - Description - Normal. Axillary  General Axillary Region: Bilateral - Description - Normal. Tenderness - Non Tender. Femoral & Inguinal  Generalized Femoral & Inguinal Lymphatics: Bilateral - Description - Normal. Tenderness - Non Tender.    Assessment & Plan  SEBACEOUS CYST OF SKIN OF LEFT BREAST (N60.82) Impression: The patient was seen recently with what appeared to be an infected sebaceous cyst in the upper left breast. The cellulitis seems to have resolved on oral antibiotics. At this point I think she would benefit from having the cyst excised. I have discussed with her in detail the risks and benefits of the operation as well as some of the technical aspects and she understands and wishes to proceed.    Signed by Caleen EssexPaul S Toth,  MD

## 2015-09-10 NOTE — Discharge Instructions (Signed)

## 2015-09-10 NOTE — Op Note (Signed)
09/10/2015  2:24 PM  PATIENT:  Jill Foley  50 y.o. female  PRE-OPERATIVE DIAGNOSIS:  sebaceous cyst left breast  POST-OPERATIVE DIAGNOSIS:  sebaceous cyst left breast  PROCEDURE:  Procedure(s) with comments: EXCISION CYST LEFT BREAST (Left) - EXCISION CYST LEFT BREAST  SURGEON:  Surgeon(s) and Role:    * Griselda MinerPaul Toth III, MD - Primary  PHYSICIAN ASSISTANT:   ASSISTANTS: none   ANESTHESIA:   general  EBL:  Total I/O In: 800 [I.V.:800] Out: 20 [Blood:20]  BLOOD ADMINISTERED:none  DRAINS: none   LOCAL MEDICATIONS USED:  MARCAINE     SPECIMEN:  Source of Specimen:  left breast sebaceous cyst  DISPOSITION OF SPECIMEN:  PATHOLOGY  COUNTS:  YES  TOURNIQUET:  * No tourniquets in log *  DICTATION: .Dragon Dictation   After informed consent was obtained the patient was brought to the operating room and placed in the supine position on the operating room table. After adequate induction of general anesthesia the patient's left breast was prepped with ChloraPrep, allowed to dry, and draped in usual sterile manner. The area around the cyst was infiltrated with  Quarter percent Marcaine. An elliptical incision was made around the cyst with the 15 blade knife. The cyst was then excised sharply full-thickness through the skin and subcutaneous tissue with the electrocautery. Once the specimen was removed and the entire cyst had been excised  The cyst was sent to pathology for further evaluation. Hemostasis was achieved using the Bovie electrocautery. The deep layer of the wound was then closed With layers of interrupted 3-0 Vicryl stitches. The skin was then closed with interrupted 4-0 Monocryl subcuticular stitches. Dermabond dressings were applied. The patient tolerated the procedure well. At the end of the case all needle sponge and instrument counts were correct. The patient was then awakened and taken to recovery in stable condition. The entire excision of the cyst measured about 4 x 2  cm  PLAN OF CARE: Discharge to home after PACU  PATIENT DISPOSITION:  PACU - hemodynamically stable.   Delay start of Pharmacological VTE agent (>24hrs) due to surgical blood loss or risk of bleeding: not applicable

## 2015-09-10 NOTE — Transfer of Care (Signed)
Immediate Anesthesia Transfer of Care Note  Patient: Jill Foley  Procedure(s) Performed: Procedure(s) with comments: EXCISION CYST LEFT BREAST (Left) - EXCISION CYST LEFT BREAST  Patient Location: PACU  Anesthesia Type:General  Level of Consciousness: awake, alert  and oriented  Airway & Oxygen Therapy: Patient Spontanous Breathing and Patient connected to face mask oxygen  Post-op Assessment: Report given to RN, Post -op Vital signs reviewed and stable and Patient moving all extremities  Post vital signs: Reviewed and stable  Last Vitals:  Filed Vitals:   09/10/15 1231 09/10/15 1430  BP: 114/63 120/81  Pulse: 77 77  Temp: 36.8 C   Resp: 18 11    Last Pain: There were no vitals filed for this visit.       Complications: No apparent anesthesia complications

## 2015-09-10 NOTE — Anesthesia Procedure Notes (Signed)
Procedure Name: LMA Insertion Date/Time: 09/10/2015 1:48 PM Performed by: Curly ShoresRAFT, Geneveive Furness W Pre-anesthesia Checklist: Patient identified, Emergency Drugs available, Suction available and Patient being monitored Patient Re-evaluated:Patient Re-evaluated prior to inductionOxygen Delivery Method: Circle system utilized Preoxygenation: Pre-oxygenation with 100% oxygen Intubation Type: IV induction Ventilation: Mask ventilation without difficulty LMA: LMA inserted LMA Size: 4.0 Number of attempts: 1 Airway Equipment and Method: Bite block Placement Confirmation: positive ETCO2 and breath sounds checked- equal and bilateral Tube secured with: Tape Dental Injury: Teeth and Oropharynx as per pre-operative assessment

## 2015-09-10 NOTE — Anesthesia Postprocedure Evaluation (Signed)
Anesthesia Post Note  Patient: Jill Foley  Procedure(s) Performed: Procedure(s) (LRB): EXCISION CYST LEFT BREAST (Left)  Patient location during evaluation: PACU Anesthesia Type: General Level of consciousness: awake and alert Pain management: pain level controlled Vital Signs Assessment: post-procedure vital signs reviewed and stable Respiratory status: spontaneous breathing, nonlabored ventilation, respiratory function stable and patient connected to nasal cannula oxygen Cardiovascular status: blood pressure returned to baseline and stable Postop Assessment: no signs of nausea or vomiting Anesthetic complications: no    Last Vitals:  Filed Vitals:   09/10/15 1500 09/10/15 1515  BP: 125/74 133/73  Pulse: 81 70  Temp:    Resp: 21 10    Last Pain:  Filed Vitals:   09/10/15 1604  PainSc: 4                  Tawni Melkonian DAVID

## 2015-09-13 ENCOUNTER — Encounter (HOSPITAL_BASED_OUTPATIENT_CLINIC_OR_DEPARTMENT_OTHER): Payer: Self-pay | Admitting: General Surgery

## 2015-12-07 ENCOUNTER — Other Ambulatory Visit: Payer: Self-pay | Admitting: Obstetrics and Gynecology

## 2015-12-07 DIAGNOSIS — Z1231 Encounter for screening mammogram for malignant neoplasm of breast: Secondary | ICD-10-CM

## 2016-01-14 ENCOUNTER — Ambulatory Visit
Admission: RE | Admit: 2016-01-14 | Discharge: 2016-01-14 | Disposition: A | Discharge status: Home / Self Care | Payer: Medicare Other | Source: Ambulatory Visit | Attending: Obstetrics and Gynecology | Admitting: Obstetrics and Gynecology

## 2016-01-14 DIAGNOSIS — Z1231 Encounter for screening mammogram for malignant neoplasm of breast: Secondary | ICD-10-CM

## 2016-05-16 ENCOUNTER — Ambulatory Visit: Payer: Medicare Other | Admitting: Adult Health

## 2016-06-22 ENCOUNTER — Ambulatory Visit (INDEPENDENT_AMBULATORY_CARE_PROVIDER_SITE_OTHER): Payer: Medicare Other | Admitting: Adult Health

## 2016-06-22 ENCOUNTER — Encounter (INDEPENDENT_AMBULATORY_CARE_PROVIDER_SITE_OTHER): Payer: Self-pay

## 2016-06-22 ENCOUNTER — Encounter: Payer: Self-pay | Admitting: Adult Health

## 2016-06-22 VITALS — BP 109/67 | HR 92 | Wt 203.0 lb

## 2016-06-22 DIAGNOSIS — G44209 Tension-type headache, unspecified, not intractable: Secondary | ICD-10-CM | POA: Diagnosis not present

## 2016-06-22 DIAGNOSIS — Z8673 Personal history of transient ischemic attack (TIA), and cerebral infarction without residual deficits: Secondary | ICD-10-CM | POA: Diagnosis not present

## 2016-06-22 NOTE — Patient Instructions (Signed)
Continue aspirin Maintain strict control of blood pressure with goal less than 130/90 Cholesterol LDL less than 70 Hemoglobin A1c less than 6.5% If you have any stroke like symptoms please and to the emergency room. Continue regular follow-ups with her primary care. If your symptoms worsen or you develop new symptoms please let us know.
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
## 2016-12-04 ENCOUNTER — Emergency Department (HOSPITAL_BASED_OUTPATIENT_CLINIC_OR_DEPARTMENT_OTHER)
Admission: EM | Admit: 2016-12-04 | Discharge: 2016-12-05 | Disposition: A | Discharge status: Home / Self Care | Payer: Medicare Other | Attending: Emergency Medicine | Admitting: Emergency Medicine

## 2016-12-04 ENCOUNTER — Encounter (HOSPITAL_BASED_OUTPATIENT_CLINIC_OR_DEPARTMENT_OTHER): Payer: Self-pay | Admitting: *Deleted

## 2016-12-04 DIAGNOSIS — E119 Type 2 diabetes mellitus without complications: Secondary | ICD-10-CM | POA: Insufficient documentation

## 2016-12-04 DIAGNOSIS — I10 Essential (primary) hypertension: Secondary | ICD-10-CM | POA: Insufficient documentation

## 2016-12-04 DIAGNOSIS — R519 Headache, unspecified: Secondary | ICD-10-CM

## 2016-12-04 DIAGNOSIS — Z79899 Other long term (current) drug therapy: Secondary | ICD-10-CM | POA: Diagnosis not present

## 2016-12-04 DIAGNOSIS — R51 Headache: Secondary | ICD-10-CM | POA: Diagnosis present

## 2016-12-04 DIAGNOSIS — Z7982 Long term (current) use of aspirin: Secondary | ICD-10-CM | POA: Insufficient documentation

## 2016-12-04 DIAGNOSIS — Z7984 Long term (current) use of oral hypoglycemic drugs: Secondary | ICD-10-CM | POA: Insufficient documentation

## 2016-12-04 NOTE — ED Provider Notes (Signed)
[facility] Provider Note   CSN: [id number] Arrival date & time: 12/04/16  1736     History   Chief Complaint Chief Complaint  Patient presents with  . Headache    HPI Jill Foley is a 51 y.o. female.  HPI  52 year old female with a history of diabetes, cholesterol, hypertension, blindness in the right eye, and multiple retinal detachments presents with headache. She states yesterday she developed a moderate right-sided headache. Felt like the skin over her scalp is sensitive. Went to her PCP at [facility] and was given IM Phenergan and IM Toradol. Felt better after going home and sleeping. Today the headache is better and almost gone. However at about 5 PM she was driving to go see her father she developed an occipital pressure. She describes is about a 3 out of 10. She states it's not really a pain but feels like pressure-like he get when you are in an airplane. No nausea, vomiting. She states she's had some blurry vision out of the left eye (right eye is blind) but it seems to be improving. No ocular pain. This does not feel like similar retinal detachments. No weakness or numbness. At this point while she has been waiting in the ER the headache/pressure is gone.  Past Medical History:  Diagnosis Date  . Achalasia   . Anemia   . Diabetes mellitus   . Hypercholesteremia   . Hypertension   . Legally blind   . Seasonal allergies   . Sinus infection   . Stroke [facility]) 2013   TIA    Patient Active Problem List   Diagnosis Date Noted  . Tension headache 05/07/2014  . Magnetic resonance imaging of brain abnormal 08/29/2012  . Disturbance of skin sensation 08/29/2012  . TIA (transient ischemic attack) 08/29/2012  . Low back pain 10/09/2011    Past Surgical History:  Procedure Laterality Date  . BREAST BIOPSY Left 09/10/2015   Procedure: EXCISION CYST LEFT BREAST;  Surgeon: Chevis Pretty III, MD;  Location: [facility];  Service: General;   Laterality: Left;  EXCISION CYST LEFT BREAST  . BREAST SURGERY Bilateral    Biopsy,LFT 2000,Right 2008  . EYE SURGERY  2000   cataract removal with lens implants, both eyes  . SKIN BIOPSY    . VITRECTOMY  lft  1999, Right2002   lft  1999, Right2002    OB History    No data available       Home Medications    Prior to Admission medications   Medication Sig Start Date End Date Taking? Authorizing Provider  aspirin 81 MG chewable tablet Chew 81 mg by mouth daily.    [provider]  BIOTIN PO Take 10,000 Units by mouth daily.     [provider]  Calcium Carb-Cholecalciferol 600-800 MG-UNIT TABS Take 1 tablet by mouth daily.    [provider]  ferrous sulfate 325 (65 FE) MG tablet Take by mouth.    [provider]  ketotifen (ALAWAY) 0.025 % ophthalmic solution Apply to eye. 05/04/15   [provider]  lisinopril-hydrochlorothiazide (PRINZIDE,ZESTORETIC) 20-12.5 MG per tablet Take 1 tablet by mouth daily.    [provider]  Magnesium Oxide 250 MG TABS Take 1 tablet by mouth daily.    [provider]  methylcellulose (ARTIFICIAL TEARS) 1 % ophthalmic solution Place 1 drop into both eyes 2 (two) times daily.    [provider]  Multiple Vitamin (MULTIVITAMIN) capsule Take 1  capsule by mouth daily.    [provider]  pravastatin (PRAVACHOL) 40 MG tablet Take 40 mg by mouth daily.    [provider]  sitaGLIPtin (JANUVIA) 50 MG tablet Take 50 mg by mouth daily.    [provider]    Family History Family History  Problem Relation Age of Onset  . Hypertension Mother   . Diabetes Mother   . Heart Problems Mother   . Stroke Mother   . Hypertension Father   . Glaucoma Father   . Diabetes Other   . Hypertension Other   . Hyperlipidemia Other   . Sudden death Neg Hx   . Heart attack Neg Hx     Social History Social History  Substance Use Topics  . Smoking status: Never Smoker    . Smokeless tobacco: Never Used  . Alcohol use No     Allergies   Fish allergy and Ivp dye [iodinated diagnostic agents]   Review of Systems Review of Systems  Constitutional: Negative for fever.  Eyes: Positive for visual disturbance. Negative for photophobia and pain.  Respiratory: Negative for shortness of breath.   Gastrointestinal: Negative for vomiting.  Musculoskeletal: Negative for neck pain and neck stiffness.  Neurological: Positive for headaches. Negative for dizziness, weakness and numbness.  All other systems reviewed and are negative.    Physical Exam Updated Vital Signs BP 120/73 (BP Location: Right Arm)   Pulse 80   Temp 98.2 F (36.8 C)   Resp 18   Ht 5\' 10"  (1.778 m)   Wt 93.9 kg (207 lb)   LMP 09/07/2010   SpO2 100%   BMI 29.70 kg/m   Physical Exam  Constitutional: She is oriented to person, place, and time. She appears well-developed and well-nourished.  HENT:  Head: Normocephalic and atraumatic.  Right Ear: External ear normal.  Left Ear: External ear normal.  Nose: Nose normal.  No scalp tenderness or swelling  Eyes: Pupils are equal, round, and reactive to light. EOM are normal. Right eye exhibits no discharge. Left eye exhibits no discharge.  Neck: Normal range of motion. Neck supple.  Cardiovascular: Normal rate, regular rhythm and normal heart sounds.   Pulmonary/Chest: Effort normal and breath sounds normal.  Abdominal: Soft. There is no tenderness.  Neurological: She is alert and oriented to person, place, and time.  CN 3-12 grossly intact. 5/5 strength in all 4 extremities. Grossly normal sensation. Normal finger to nose.   Skin: Skin is warm and dry.  Nursing note and vitals reviewed.    ED Treatments / Results  Labs (all labs ordered are listed, but only abnormal results are displayed) Labs Reviewed - No data to display  EKG  EKG Interpretation None       Radiology No results found.  Procedures Procedures  (including critical care time)  Medications Ordered in ED Medications - No data to display   Initial Impression / Assessment and Plan / ED Course  I have reviewed the triage vital signs and the nursing notes.  Pertinent labs & imaging results that were available during my care of the patient were reviewed by me and considered in my medical decision making (see chart for details).     Unclear cause of the patient's transient occipital "pressure". It was not very severe, so my suspicion of subarachnoid hemorrhage is low. I offered CT head imaging but discussed that she is at lower risk. She defers this at this time. It may be related to why her  eyes or blurry causing some mild headache. She declines further testing on her eyes, stating no pain and she highly doubts retinal detachment as this feels different. The blurriness is almost improved. The right-sided headache is also unclear etiology and is gone. I discussed that I think this is a reasonable strategy and if she has any recurrence of pain she is walking back and we will reevaluate for possible imaging. Suspicion of acute stroke or infection is quite low. Discussed return precautions. Follow-up with PCP and ophthalmology.  Final Clinical Impressions(s) / ED Diagnoses   Final diagnoses:  Occipital headache    New Prescriptions New Prescriptions   No medications on file     Pricilla LovelessGoldston, Natsuko Kelsay, MD 12/04/16 2003

## 2016-12-04 NOTE — ED Triage Notes (Signed)
Pt c/o " head pressure " x 2 days, seen by PMD yesterday for same DX migraine given phenergan and Toradol injection with improvement. Woke up this am with same feeling. Pt states BP is low

## 2016-12-04 NOTE — ED Notes (Signed)
Patient stated that she does note really feel the pain, it is more of a pressure to the bottom of her head and tingling sensation on the top of her head.

## 2016-12-20 ENCOUNTER — Other Ambulatory Visit: Payer: Self-pay | Admitting: Obstetrics and Gynecology

## 2016-12-20 DIAGNOSIS — Z1231 Encounter for screening mammogram for malignant neoplasm of breast: Secondary | ICD-10-CM

## 2017-01-16 ENCOUNTER — Ambulatory Visit
Admission: RE | Admit: 2017-01-16 | Discharge: 2017-01-16 | Disposition: A | Discharge status: Home / Self Care | Payer: Medicare Other | Source: Ambulatory Visit | Attending: Obstetrics and Gynecology | Admitting: Obstetrics and Gynecology

## 2017-01-16 DIAGNOSIS — Z1231 Encounter for screening mammogram for malignant neoplasm of breast: Secondary | ICD-10-CM

## 2017-08-10 DIAGNOSIS — E041 Nontoxic single thyroid nodule: Secondary | ICD-10-CM | POA: Insufficient documentation

## 2017-12-18 ENCOUNTER — Other Ambulatory Visit: Payer: Self-pay | Admitting: Obstetrics and Gynecology

## 2017-12-18 DIAGNOSIS — Z1231 Encounter for screening mammogram for malignant neoplasm of breast: Secondary | ICD-10-CM

## 2018-01-28 ENCOUNTER — Ambulatory Visit
Admission: RE | Admit: 2018-01-28 | Discharge: 2018-01-28 | Disposition: A | Discharge status: Home / Self Care | Payer: Medicare Other | Source: Ambulatory Visit | Attending: Obstetrics and Gynecology | Admitting: Obstetrics and Gynecology

## 2018-01-28 DIAGNOSIS — Z1231 Encounter for screening mammogram for malignant neoplasm of breast: Secondary | ICD-10-CM

## 2018-03-24 ENCOUNTER — Emergency Department (HOSPITAL_BASED_OUTPATIENT_CLINIC_OR_DEPARTMENT_OTHER): Payer: Medicare Other

## 2018-03-24 ENCOUNTER — Other Ambulatory Visit: Payer: Self-pay

## 2018-03-24 ENCOUNTER — Encounter (HOSPITAL_BASED_OUTPATIENT_CLINIC_OR_DEPARTMENT_OTHER): Payer: Self-pay | Admitting: Emergency Medicine

## 2018-03-24 ENCOUNTER — Emergency Department (HOSPITAL_BASED_OUTPATIENT_CLINIC_OR_DEPARTMENT_OTHER)
Admission: EM | Admit: 2018-03-24 | Discharge: 2018-03-24 | Disposition: A | Discharge status: Home / Self Care | Payer: Medicare Other | Attending: Emergency Medicine | Admitting: Emergency Medicine

## 2018-03-24 DIAGNOSIS — Z7982 Long term (current) use of aspirin: Secondary | ICD-10-CM | POA: Insufficient documentation

## 2018-03-24 DIAGNOSIS — Z7984 Long term (current) use of oral hypoglycemic drugs: Secondary | ICD-10-CM | POA: Insufficient documentation

## 2018-03-24 DIAGNOSIS — Z79899 Other long term (current) drug therapy: Secondary | ICD-10-CM | POA: Diagnosis not present

## 2018-03-24 DIAGNOSIS — I10 Essential (primary) hypertension: Secondary | ICD-10-CM | POA: Insufficient documentation

## 2018-03-24 DIAGNOSIS — R519 Headache, unspecified: Secondary | ICD-10-CM

## 2018-03-24 DIAGNOSIS — R51 Headache: Secondary | ICD-10-CM | POA: Diagnosis present

## 2018-03-24 DIAGNOSIS — E119 Type 2 diabetes mellitus without complications: Secondary | ICD-10-CM | POA: Insufficient documentation

## 2018-03-24 MED ORDER — METOCLOPRAMIDE HCL 5 MG/ML IJ SOLN
10.0000 mg | Freq: Once | INTRAMUSCULAR | Status: AC
  Administered 2018-03-24: 10 mg via INTRAVENOUS
  Filled 2018-03-24: qty 2

## 2018-03-24 MED ORDER — KETOROLAC TROMETHAMINE 30 MG/ML IJ SOLN
30.0000 mg | Freq: Once | INTRAMUSCULAR | Status: AC
  Administered 2018-03-24: 30 mg via INTRAVENOUS
  Filled 2018-03-24: qty 1

## 2018-03-24 MED ORDER — SODIUM CHLORIDE 0.9 % IV BOLUS
1000.0000 mL | Freq: Once | INTRAVENOUS | Status: AC
  Administered 2018-03-24: 1000 mL via INTRAVENOUS

## 2018-03-24 NOTE — ED Notes (Signed)
Pt enrolled in aromatherapy pain trial 

## 2018-03-24 NOTE — ED Provider Notes (Signed)
[facility] EMERGENCY DEPARTMENT Provider Note   CSN: [id number] Arrival date & time: 03/24/18  1229     History   Chief Complaint Chief Complaint  Patient presents with  . Headache    HPI Jill Foley is a 52 y.o. female.  HPI Patient presents to the emergency department with with a mid posterior headache that she reports is a pressure that started last night.  The patient states she has had no other symptoms associated with this.  Patient states she did not take any medications prior to arrival for her symptoms.  Patient states she is not had any difficulty with walking dizziness or numbness.  The patient denies chest pain, shortness of breath,blurred vision, neck pain, fever, cough, weakness, numbness, dizziness, anorexia, edema, abdominal pain, nausea, vomiting, diarrhea, rash, back pain, dysuria, hematemesis, bloody stool, near syncope, or syncope. Past Medical History:  Diagnosis Date  . Achalasia   . Anemia   . Diabetes mellitus   . Hypercholesteremia   . Hypertension   . Legally blind   . Seasonal allergies   . Sinus infection   . Stroke [facility] 2013   TIA    Patient Active Problem List   Diagnosis Date Noted  . Tension headache 05/07/2014  . Magnetic resonance imaging of brain abnormal 08/29/2012  . Disturbance of skin sensation 08/29/2012  . TIA (transient ischemic attack) 08/29/2012  . Low back pain 10/09/2011    Past Surgical History:  Procedure Laterality Date  . BREAST BIOPSY Left 09/10/2015   Procedure: EXCISION CYST LEFT BREAST;  Surgeon: Chevis PrettyPaul Toth III, MD;  Location:  [facility];  Service: General;  Laterality: Left;  EXCISION CYST LEFT BREAST  . BREAST BIOPSY Right 2008   benign  . BREAST EXCISIONAL BIOPSY Left    benign  . BREAST SURGERY Bilateral    Biopsy,LFT 2000,Right 2008  . EYE SURGERY  2000   cataract removal with lens implants, both eyes  . SKIN BIOPSY    . VITRECTOMY  lft  1999, Right2002   lft  1999,  Right2002     OB History   No obstetric history on file.      Home Medications    Prior to Admission medications   Medication Sig Start Date End Date Taking? Authorizing Provider  aspirin 81 MG chewable tablet Chew 81 mg by mouth daily.   Yes [provider]  BIOTIN PO Take 10,000 Units by mouth daily.    Yes [provider]  cholecalciferol (VITAMIN D3) 25 MCG (1000 UT) tablet Take 2,000 Units by mouth daily.   Yes [provider]  ferrous sulfate 325 (65 FE) MG tablet Take by mouth.   Yes [provider]  lisinopril-hydrochlorothiazide (PRINZIDE,ZESTORETIC) 20-12.5 MG per tablet Take 1 tablet by mouth daily.   Yes [provider]  Magnesium Oxide 250 MG TABS Take 1 tablet by mouth daily.   Yes [provider]  methylcellulose (ARTIFICIAL TEARS) 1 % ophthalmic solution Place 1 drop into both eyes 2 (two) times daily.   Yes [provider]  Multiple Vitamin (MULTIVITAMIN) capsule Take 1 capsule by mouth daily.   Yes [provider]  pravastatin (PRAVACHOL) 40 MG tablet Take 40 mg by mouth daily.   Yes [provider]  sitaGLIPtin (JANUVIA) 50 MG tablet Take 50 mg by mouth daily.   Yes [provider]  Calcium Carb-Cholecalciferol 600-800 MG-UNIT TABS Take 1 tablet by mouth daily.    [provider]  ketotifen (ALAWAY) 0.025 % ophthalmic solution Apply to eye. 05/04/15   [provider]    Family History Family History  Problem Relation Age of Onset  . Hypertension Mother   . Diabetes Mother   . Heart Problems Mother   . Stroke Mother   . Hypertension Father   . Glaucoma Father   . Diabetes Other   . Hypertension Other   . Hyperlipidemia Other   . Breast cancer Paternal Aunt   . Sudden death Neg Hx   . Heart attack Neg Hx     Social History Social History   Tobacco Use  . Smoking status: Never Smoker  . Smokeless tobacco: Never Used  Substance Use Topics  .  Alcohol use: No    Alcohol/week: 0.0 standard drinks  . Drug use: No     Allergies   Fish allergy and Ivp dye [iodinated diagnostic agents]   Review of Systems Review of Systems All other systems negative except as documented in the HPI. All pertinent positives and negatives as reviewed in the HPI.  Physical Exam Updated Vital Signs BP 129/75 (BP Location: Right Arm)   Pulse 70   Temp 98.2 F (36.8 C) (Oral)   Resp 18   Ht 5\' 10"  (1.778 m)   Wt 94.3 kg   LMP 09/07/2010   SpO2 100%   BMI 29.84 kg/m   Physical Exam Vitals signs and nursing note reviewed.  Constitutional:      General: She is not in acute distress.    Appearance: She is well-developed.  HENT:     Head: Normocephalic and atraumatic.  Eyes:     Pupils: Pupils are equal, round, and reactive to light.  Neck:     Musculoskeletal: Normal range of motion and neck supple.  Cardiovascular:     Rate and Rhythm: Normal rate and regular rhythm.     Heart sounds: Normal heart sounds. No murmur. No friction rub. No gallop.   Pulmonary:     Effort: Pulmonary effort is normal. No respiratory distress.     Breath sounds: Normal breath sounds. No wheezing.  Abdominal:     General: Bowel sounds are normal. There is no distension.     Palpations: Abdomen is soft.     Tenderness: There is no abdominal tenderness.  Skin:    General: Skin is warm and dry.     Capillary Refill: Capillary refill takes less than 2 seconds.     Findings: No erythema or rash.  Neurological:     Mental Status: She is alert and oriented to person, place, and time.     Sensory: Sensation is intact.     Motor: Motor function is intact. No weakness or abnormal muscle tone.     Coordination: Coordination normal. Finger-Nose-Finger Test and Heel to Shin Test normal.     Gait: Gait normal.  Psychiatric:        Mood and Affect: Mood normal.        Speech: Speech normal.        Behavior: Behavior normal.      ED Treatments / Results   Labs (all labs ordered are listed, but only abnormal results are displayed) Labs Reviewed - No data to display  EKG None  Radiology Ct Head Wo Contrast  Result Date: 03/24/2018 CLINICAL DATA:  Left posterior headache since last night. EXAM: CT HEAD WITHOUT CONTRAST TECHNIQUE: Contiguous axial images were obtained from the base of the skull through the vertex without intravenous contrast.  COMPARISON:  10/20/2011 FINDINGS: Brain: Ventricles, cisterns and other CSF spaces are normal. There is no mass, mass effect, shift of midline structures or acute hemorrhage. No evidence of acute infarction. Vascular: No hyperdense vessel or unexpected calcification. Skull: Normal. Negative for fracture or focal lesion. Sinuses/Orbits: Dense rim calcified right globe unchanged. Sinuses are clear. Other: None. IMPRESSION: No acute findings. Electronically Signed   By: Elberta Fortis M.D.   On: 03/24/2018 15:18    Procedures Procedures (including critical care time)  Medications Ordered in ED Medications  sodium chloride 0.9 % bolus 1,000 mL (1,000 mLs Intravenous New Bag/Given 03/24/18 1516)  ketorolac (TORADOL) 30 MG/ML injection 30 mg (30 mg Intravenous Given 03/24/18 1547)  metoCLOPramide (REGLAN) injection 10 mg (10 mg Intravenous Given 03/24/18 1547)     Initial Impression / Assessment and Plan / ED Course  I have reviewed the triage vital signs and the nursing notes.  Pertinent labs & imaging results that were available during my care of the patient were reviewed by me and considered in my medical decision making (see chart for details).     Patient has a negative head CT.  She does not have any neurological deficits noted on examination.  I feel that this headache is not related to a significant intracranial abnormality or etiology.  I have advised the patient to return for any changes or worsening in her condition.  The patient agrees to this plan and all questions were answered.  I feel that  this is in the area of where the scalp muscles and trapezius muscles and certain and originate.  Final Clinical Impressions(s) / ED Diagnoses   Final diagnoses:  None    ED Discharge Orders    None       Charlestine Night, PA-C 03/24/18 1609    Arby Barrette, MD 03/25/18 1546

## 2018-03-24 NOTE — ED Triage Notes (Signed)
Reports posterior headache since last night.  Denies nausea, vomiting, diarrhea. Denies dizziness, blurred vision, weakness, numbness, tingling.  Patient does have left eye droop which she has had surgery on in the past.  States this is not new.  Ambulatory to triage in NAD.

## 2018-03-24 NOTE — Discharge Instructions (Addendum)
Return here as needed. Follow up with your doctor. 800mg  of motrin if needed every 6-8 hours

## 2018-04-09 DIAGNOSIS — K220 Achalasia of cardia: Secondary | ICD-10-CM | POA: Insufficient documentation

## 2018-04-09 DIAGNOSIS — D509 Iron deficiency anemia, unspecified: Secondary | ICD-10-CM | POA: Insufficient documentation

## 2018-06-11 DIAGNOSIS — J329 Chronic sinusitis, unspecified: Secondary | ICD-10-CM | POA: Insufficient documentation

## 2018-09-23 DIAGNOSIS — F4329 Adjustment disorder with other symptoms: Secondary | ICD-10-CM | POA: Insufficient documentation

## 2019-01-15 ENCOUNTER — Other Ambulatory Visit: Payer: Self-pay | Admitting: Obstetrics and Gynecology

## 2019-01-15 DIAGNOSIS — Z1231 Encounter for screening mammogram for malignant neoplasm of breast: Secondary | ICD-10-CM

## 2019-01-21 DIAGNOSIS — N183 Chronic kidney disease, stage 3 unspecified: Secondary | ICD-10-CM | POA: Diagnosis present

## 2019-03-06 ENCOUNTER — Ambulatory Visit
Admission: RE | Admit: 2019-03-06 | Discharge: 2019-03-06 | Disposition: A | Discharge status: Home / Self Care | Payer: Medicare Other | Source: Ambulatory Visit | Attending: Obstetrics and Gynecology | Admitting: Obstetrics and Gynecology

## 2019-03-06 ENCOUNTER — Other Ambulatory Visit: Payer: Self-pay

## 2019-03-06 DIAGNOSIS — Z1231 Encounter for screening mammogram for malignant neoplasm of breast: Secondary | ICD-10-CM

## 2020-01-05 DIAGNOSIS — E669 Obesity, unspecified: Secondary | ICD-10-CM | POA: Insufficient documentation

## 2020-01-05 DIAGNOSIS — I10 Essential (primary) hypertension: Secondary | ICD-10-CM | POA: Diagnosis present

## 2020-01-06 DIAGNOSIS — E785 Hyperlipidemia, unspecified: Secondary | ICD-10-CM | POA: Diagnosis present

## 2020-01-27 ENCOUNTER — Other Ambulatory Visit: Payer: Self-pay | Admitting: Obstetrics and Gynecology

## 2020-01-27 DIAGNOSIS — Z1231 Encounter for screening mammogram for malignant neoplasm of breast: Secondary | ICD-10-CM

## 2020-03-09 ENCOUNTER — Ambulatory Visit: Payer: Medicare Other

## 2020-03-11 ENCOUNTER — Other Ambulatory Visit: Payer: Self-pay

## 2020-03-11 ENCOUNTER — Ambulatory Visit
Admission: RE | Admit: 2020-03-11 | Discharge: 2020-03-11 | Disposition: A | Discharge status: Home / Self Care | Payer: Medicare Other | Source: Ambulatory Visit | Attending: Obstetrics and Gynecology | Admitting: Obstetrics and Gynecology

## 2020-03-11 DIAGNOSIS — Z1231 Encounter for screening mammogram for malignant neoplasm of breast: Secondary | ICD-10-CM

## 2020-05-28 DIAGNOSIS — M25571 Pain in right ankle and joints of right foot: Secondary | ICD-10-CM | POA: Insufficient documentation

## 2020-09-27 ENCOUNTER — Other Ambulatory Visit (HOSPITAL_BASED_OUTPATIENT_CLINIC_OR_DEPARTMENT_OTHER): Payer: Self-pay

## 2020-09-27 ENCOUNTER — Ambulatory Visit: Payer: Medicare Other | Attending: Internal Medicine

## 2020-09-27 DIAGNOSIS — Z23 Encounter for immunization: Secondary | ICD-10-CM

## 2020-09-27 MED ORDER — COVID-19 MRNA VAC-TRIS(PFIZER) 30 MCG/0.3ML IM SUSP
INTRAMUSCULAR | 0 refills | Status: DC
  Filled 2020-09-27: qty 0.3, 1d supply, fill #0

## 2020-09-27 NOTE — Progress Notes (Signed)
   Covid-19 Vaccination Clinic  Name:  Jill Foley    MRN: [id number] DOB: Jun 20, 1965  09/27/2020  [name] was observed post Covid-19 immunization for 15 minutes without incident. She was provided with Vaccine Information Sheet and instruction to access the V-Safe system.   [name] was instructed to call 911 with any severe reactions post vaccine: Difficulty breathing  Swelling of face and throat  A fast heartbeat  A bad rash all over body  Dizziness and weakness   Immunizations Administered     Name Date Dose VIS Date Route   PFIZER Comrnaty(Gray TOP) Covid-19 Vaccine 09/27/2020 12:50 PM 0.3 mL 03/11/2020 Intramuscular   Manufacturer: [employer], [employer]   Lot: [id number]   NDC: [phone]

## 2020-10-07 DIAGNOSIS — L853 Xerosis cutis: Secondary | ICD-10-CM | POA: Insufficient documentation

## 2020-10-07 DIAGNOSIS — J301 Allergic rhinitis due to pollen: Secondary | ICD-10-CM | POA: Insufficient documentation

## 2020-12-01 ENCOUNTER — Emergency Department (HOSPITAL_BASED_OUTPATIENT_CLINIC_OR_DEPARTMENT_OTHER)
Admission: EM | Admit: 2020-12-01 | Discharge: 2020-12-01 | Disposition: A | Discharge status: Home / Self Care | Payer: Medicare Other | Attending: Emergency Medicine | Admitting: Emergency Medicine

## 2020-12-01 ENCOUNTER — Other Ambulatory Visit: Payer: Self-pay

## 2020-12-01 ENCOUNTER — Encounter (HOSPITAL_BASED_OUTPATIENT_CLINIC_OR_DEPARTMENT_OTHER): Payer: Self-pay

## 2020-12-01 DIAGNOSIS — I10 Essential (primary) hypertension: Secondary | ICD-10-CM | POA: Diagnosis not present

## 2020-12-01 DIAGNOSIS — R42 Dizziness and giddiness: Secondary | ICD-10-CM | POA: Diagnosis not present

## 2020-12-01 DIAGNOSIS — Z7982 Long term (current) use of aspirin: Secondary | ICD-10-CM | POA: Insufficient documentation

## 2020-12-01 DIAGNOSIS — D72819 Decreased white blood cell count, unspecified: Secondary | ICD-10-CM | POA: Insufficient documentation

## 2020-12-01 DIAGNOSIS — Z20822 Contact with and (suspected) exposure to covid-19: Secondary | ICD-10-CM | POA: Diagnosis not present

## 2020-12-01 DIAGNOSIS — E119 Type 2 diabetes mellitus without complications: Secondary | ICD-10-CM | POA: Insufficient documentation

## 2020-12-01 DIAGNOSIS — R110 Nausea: Secondary | ICD-10-CM | POA: Insufficient documentation

## 2020-12-01 DIAGNOSIS — Z79899 Other long term (current) drug therapy: Secondary | ICD-10-CM | POA: Diagnosis not present

## 2020-12-01 LAB — CBG MONITORING, ED: Glucose-Capillary: 82 mg/dL (ref 70–99)

## 2020-12-01 LAB — CBC WITH DIFFERENTIAL/PLATELET
Abs Immature Granulocytes: 0.01 10*3/uL (ref 0.00–0.07)
Basophils Absolute: 0 10*3/uL (ref 0.0–0.1)
Basophils Relative: 1 %
Eosinophils Absolute: 0.2 10*3/uL (ref 0.0–0.5)
Eosinophils Relative: 5 %
HCT: 40.5 % (ref 36.0–46.0)
Hemoglobin: 13.1 g/dL (ref 12.0–15.0)
Immature Granulocytes: 0 %
Lymphocytes Relative: 22 %
Lymphs Abs: 0.7 10*3/uL (ref 0.7–4.0)
MCH: 26.4 pg (ref 26.0–34.0)
MCHC: 32.3 g/dL (ref 30.0–36.0)
MCV: 81.7 fL (ref 80.0–100.0)
Monocytes Absolute: 0.5 10*3/uL (ref 0.1–1.0)
Monocytes Relative: 15 %
Neutro Abs: 1.8 10*3/uL (ref 1.7–7.7)
Neutrophils Relative %: 57 %
Platelets: 281 10*3/uL (ref 150–400)
RBC: 4.96 MIL/uL (ref 3.87–5.11)
RDW: 13.5 % (ref 11.5–15.5)
WBC: 3.1 10*3/uL — ABNORMAL LOW (ref 4.0–10.5)
nRBC: 0 % (ref 0.0–0.2)

## 2020-12-01 LAB — COMPREHENSIVE METABOLIC PANEL
ALT: 28 U/L (ref 0–44)
AST: 37 U/L (ref 15–41)
Albumin: 4.2 g/dL (ref 3.5–5.0)
Alkaline Phosphatase: 59 U/L (ref 38–126)
Anion gap: 12 (ref 5–15)
BUN: 23 mg/dL — ABNORMAL HIGH (ref 6–20)
CO2: 27 mmol/L (ref 22–32)
Calcium: 9.7 mg/dL (ref 8.9–10.3)
Chloride: 97 mmol/L — ABNORMAL LOW (ref 98–111)
Creatinine, Ser: 1.59 mg/dL — ABNORMAL HIGH (ref 0.44–1.00)
GFR, Estimated: 38 mL/min — ABNORMAL LOW (ref >60)
Glucose, Bld: 101 mg/dL — ABNORMAL HIGH (ref 70–99)
Potassium: 3.7 mmol/L (ref 3.5–5.1)
Sodium: 136 mmol/L (ref 135–145)
Total Bilirubin: 0.3 mg/dL (ref 0.3–1.2)
Total Protein: 7.6 g/dL (ref 6.5–8.1)

## 2020-12-01 LAB — RESP PANEL BY RT-PCR (FLU A&B, COVID) ARPGX2
Influenza A by PCR: NEGATIVE
Influenza B by PCR: NEGATIVE
SARS Coronavirus 2 by RT PCR: NEGATIVE

## 2020-12-01 MED ORDER — ONDANSETRON 4 MG PO TBDP: 4.0000 mg | ORAL_TABLET | Freq: Three times a day (TID) | ORAL | 0 refills | Status: AC | PRN

## 2020-12-01 NOTE — Discharge Instructions (Addendum)
Your work-up was quite reassuring today.  Please follow-up with your primary care provider.  Please drink plenty of water.  I also prescribed you the nausea medicine you had here in the ER.   You may always return to the emergency department for any new or concerning symptoms.  Otherwise please follow-up with your primary care provider.  Try plenty of water.  Please take your prescribed medications

## 2020-12-01 NOTE — ED Provider Notes (Signed)
[facility] EMERGENCY DEPARTMENT Provider Note   CSN: [id number] Arrival date & time: 12/01/20  1719     History Chief Complaint  Patient presents with   Nausea    Jill Foley is a 55 y.o. female.  HPI Patient is a 55 year old female with a past medical history significant for TIA, DM2, HL D, HTN  She is presenting to the ER today with concerns of transient episode of nausea that occurred around 9 AM this morning.  She states she also felt somewhat lightheaded intermittently over the course of the day she states this occurred more when she stood up and felt improved when she sat down.  She states that she has been drinking water and no longer feels lightheaded.  She denies any chest pain shortness of breath or palpitations or neck pain or headache.  No fevers or chills no nausea vomiting or diarrhea currently.  She denies any vomiting at all.  Denies any diarrhea.  Denies any chest pain or shortness of breath.  No vertiginous symptoms or room spinning.  No other associate symptoms.  She assures me that she is completely asymptomatic at this time.  Came to the ER for evaluation of earlier symptoms.     Past Medical History:  Diagnosis Date   Achalasia    Anemia    Diabetes mellitus    Hypercholesteremia    Hypertension    Legally blind    Seasonal allergies    Sinus infection    Stroke [facility] 2013   TIA    Patient Active Problem List   Diagnosis Date Noted   Tension headache 05/07/2014   Magnetic resonance imaging of brain abnormal 08/29/2012   Disturbance of skin sensation 08/29/2012   TIA (transient ischemic attack) 08/29/2012   Low back pain 10/09/2011    Past Surgical History:  Procedure Laterality Date   BREAST BIOPSY Left 09/10/2015   Procedure: EXCISION CYST LEFT BREAST;  Surgeon: Chevis Pretty III, MD;  Location: [facility];  Service: General;  Laterality: Left;  EXCISION CYST LEFT BREAST   BREAST BIOPSY Right 2008   benign    BREAST EXCISIONAL BIOPSY Left    benign   BREAST SURGERY Bilateral    Biopsy,LFT 2000,Right 2008   EYE SURGERY  2000   cataract removal with lens implants, both eyes   SKIN BIOPSY     VITRECTOMY  lft  1999, Right2002   lft  1999, Right2002     OB History   No obstetric history on file.     Family History  Problem Relation Age of Onset   Hypertension Mother    Diabetes Mother    Heart Problems Mother    Stroke Mother    Hypertension Father    Glaucoma Father    Diabetes Other    Hypertension Other    Hyperlipidemia Other    Breast cancer Paternal Aunt    Sudden death Neg Hx    Heart attack Neg Hx     Social History   Tobacco Use   Smoking status: Never   Smokeless tobacco: Never  Substance Use Topics   Alcohol use: No    Alcohol/week: 0.0 standard drinks   Drug use: No    Home Medications Prior to Admission medications   Medication Sig Start Date End Date Taking? Authorizing Provider  ondansetron (ZOFRAN ODT) 4 MG disintegrating tablet Take 1 tablet (4 mg total) by mouth every 8 (eight) hours as needed for nausea or vomiting.  12/01/20  Yes Solon Augusta S, PA  aspirin 81 MG chewable tablet Chew 81 mg by mouth daily.    [provider]  BIOTIN PO Take 10,000 Units by mouth daily.     [provider]  Calcium Carb-Cholecalciferol 600-800 MG-UNIT TABS Take 1 tablet by mouth daily.    [provider]  cholecalciferol (VITAMIN D3) 25 MCG (1000 UT) tablet Take 2,000 Units by mouth daily.    [provider]  COVID-19 mRNA Vac-TriS, Pfizer, SUSP injection Inject into the muscle. 09/27/20   Judyann Munson, MD  ferrous sulfate 325 (65 FE) MG tablet Take by mouth.    [provider]  ketotifen (ALAWAY) 0.025 % ophthalmic solution Apply to eye. 05/04/15   [provider]  lisinopril-hydrochlorothiazide (PRINZIDE,ZESTORETIC) 20-12.5 MG per tablet Take 1 tablet by mouth daily.    [provider]  Magnesium Oxide 250  MG TABS Take 1 tablet by mouth daily.    [provider]  methylcellulose (ARTIFICIAL TEARS) 1 % ophthalmic solution Place 1 drop into both eyes 2 (two) times daily.    [provider]  Multiple Vitamin (MULTIVITAMIN) capsule Take 1 capsule by mouth daily.    [provider]  pravastatin (PRAVACHOL) 40 MG tablet Take 40 mg by mouth daily.    [provider]  sitaGLIPtin (JANUVIA) 50 MG tablet Take 50 mg by mouth daily.    [provider]    Allergies    Fish allergy, Ivp dye [iodinated diagnostic agents], and Xalatan [latanoprost]  Review of Systems   Review of Systems  Constitutional:  Negative for chills and fever.  HENT:  Negative for congestion.   Eyes:  Negative for pain.  Respiratory:  Negative for cough and shortness of breath.   Cardiovascular:  Negative for chest pain and leg swelling.  Gastrointestinal:  Positive for nausea. Negative for abdominal pain and vomiting.  Genitourinary:  Negative for dysuria.  Musculoskeletal:  Negative for myalgias.  Skin:  Negative for rash.  Neurological:  Negative for dizziness and headaches.   Physical Exam Updated Vital Signs BP (!) 141/71   Pulse 74   Temp 98.6 F (37 C) (Oral)   Resp 14   Ht 5\' 10"  (1.778 m)   Wt 94.8 kg   LMP 09/07/2010   SpO2 100%   BMI 29.99 kg/m   Physical Exam Vitals and nursing note reviewed.  Constitutional:      General: She is not in acute distress.    Comments: Pleasant well-appearing 55 year old.  In no acute distress.  Sitting comfortably in bed.  Able answer questions appropriately follow commands. No increased work of breathing. Speaking in full sentences.   HENT:     Head: Normocephalic and atraumatic.     Nose: Nose normal.  Eyes:     General: No scleral icterus. Cardiovascular:     Rate and Rhythm: Normal rate and regular rhythm.     Pulses: Normal pulses.     Heart sounds: Normal heart sounds.  Pulmonary:     Effort: Pulmonary effort is  normal. No respiratory distress.     Breath sounds: No wheezing.  Abdominal:     Palpations: Abdomen is soft.     Tenderness: There is no abdominal tenderness.  Musculoskeletal:     Cervical back: Normal range of motion.     Right lower leg: No edema.     Left lower leg: No edema.  Skin:    General: Skin is warm and dry.  Capillary Refill: Capillary refill takes less than 2 seconds.  Neurological:     Mental Status: She is alert. Mental status is at baseline.     Comments: Alert and oriented to self, place, time and event.   Speech is fluent, clear without dysarthria or dysphasia.   Strength 5/5 in upper/lower extremities   Sensation intact in upper/lower extremities   Normal finger-to-nose and feet tapping.  CN I not tested  CN II PT IS BLIND CN III, IV, VI PERRLA and EOMs intact bilaterally  CN V Intact sensation to sharp and light touch to the face  CN VII facial movements symmetric  CN VIII not tested  CN IX, X no uvula deviation, symmetric rise of soft palate  CN XI 5/5 SCM and trapezius strength bilaterally  CN XII Midline tongue protrusion, symmetric L/R movements   Psychiatric:        Mood and Affect: Mood normal.        Behavior: Behavior normal.    ED Results / Procedures / Treatments   Labs (all labs ordered are listed, but only abnormal results are displayed) Labs Reviewed  COMPREHENSIVE METABOLIC PANEL - Abnormal; Notable for the following components:      Result Value   Chloride 97 (*)    Glucose, Bld 101 (*)    BUN 23 (*)    Creatinine, Ser 1.59 (*)    GFR, Estimated 38 (*)    All other components within normal limits  CBC WITH DIFFERENTIAL/PLATELET - Abnormal; Notable for the following components:   WBC 3.1 (*)    All other components within normal limits  RESP PANEL BY RT-PCR (FLU A&B, COVID) ARPGX2  CBG MONITORING, ED    EKG EKG Interpretation  Date/Time:  Wednesday December 01 2020 17:36:57 EDT Ventricular Rate:  80 PR  Interval:  164 QRS Duration: 102 QT Interval:  388 QTC Calculation: 448 R Axis:   27 Text Interpretation: Sinus rhythm Consider left atrial enlargement No significant change since last tracing Confirmed by Melene PlanFloyd, Dan [id number]) on 12/01/2020 5:41:00 PM  Radiology No results found.  Procedures Procedures   Medications Ordered in ED Medications - No data to display  ED Course  I have reviewed the triage vital signs and the nursing notes.  Pertinent labs & imaging results that were available during my care of the patient were reviewed by me and considered in my medical decision making (see chart for details).    MDM Rules/Calculators/A&P                           Patient is a well-appearing 55 year old female  Overall she is well-appearing.  Here for some nausea and lightheadedness earlier today but is completely resolved.  She is neurologically intact physical exam is reassuring.  CBC with mild leukopenia no other abnormalities.  CMP with creatinine that has marginally increased from years ago.  It is not significantly changed. Patient appears well-hydrated.  Tolerated p.o. fluids here in the ER.  She will recheck labs with PCP within the week.  Return precautions given.  COVID influenza negative.  Vital signs within normal limits.  Patient continues to be asymptomatic.  Will discharge home at this time.  EKG nonischemic no ST-T wave abnormalities no normal sinus rhythm.  Mellody DrownKimberly Anne Roppolo was evaluated in Emergency Department on 12/01/2020 for the symptoms described in the history of present illness. She was evaluated in the context of the global COVID-19 pandemic, which necessitated  consideration that the patient might be at risk for infection with the SARS-CoV-2 virus that causes COVID-19. Institutional protocols and algorithms that pertain to the evaluation of patients at risk for COVID-19 are in a state of rapid change based on information released by regulatory bodies  including the CDC and federal and state organizations. These policies and algorithms were followed during the patient's care in the ED.   Final Clinical Impression(s) / ED Diagnoses Final diagnoses:  Nausea    Rx / DC Orders ED Discharge Orders          Ordered    ondansetron (ZOFRAN ODT) 4 MG disintegrating tablet  Every 8 hours PRN        12/01/20 1951             Gailen Shelter, [place] 12/01/20 2136    Melene Plan, DO 12/01/20 2250

## 2020-12-01 NOTE — ED Notes (Signed)
Pt given fluids to try

## 2020-12-01 NOTE — ED Notes (Signed)
Pt able to tolerate po

## 2020-12-01 NOTE — ED Triage Notes (Signed)
Pt states she woke ~9am with nausea-feeling lightheaded started ~2pm-denies as dizziness-denies pain-NAD-steady gait

## 2021-01-31 ENCOUNTER — Other Ambulatory Visit: Payer: Self-pay | Admitting: Obstetrics and Gynecology

## 2021-01-31 DIAGNOSIS — Z1231 Encounter for screening mammogram for malignant neoplasm of breast: Secondary | ICD-10-CM

## 2021-03-15 ENCOUNTER — Other Ambulatory Visit: Payer: Self-pay

## 2021-03-15 ENCOUNTER — Ambulatory Visit
Admission: RE | Admit: 2021-03-15 | Discharge: 2021-03-15 | Disposition: A | Discharge status: Home / Self Care | Payer: Medicare Other | Source: Ambulatory Visit | Attending: Obstetrics and Gynecology | Admitting: Obstetrics and Gynecology

## 2021-03-15 DIAGNOSIS — Z1231 Encounter for screening mammogram for malignant neoplasm of breast: Secondary | ICD-10-CM

## 2021-05-18 DIAGNOSIS — H04123 Dry eye syndrome of bilateral lacrimal glands: Secondary | ICD-10-CM | POA: Insufficient documentation

## 2022-02-02 ENCOUNTER — Other Ambulatory Visit: Payer: Self-pay | Admitting: Obstetrics and Gynecology

## 2022-02-02 DIAGNOSIS — Z1231 Encounter for screening mammogram for malignant neoplasm of breast: Secondary | ICD-10-CM

## 2022-02-05 DIAGNOSIS — L851 Acquired keratosis [keratoderma] palmaris et plantaris: Secondary | ICD-10-CM | POA: Insufficient documentation

## 2022-04-05 ENCOUNTER — Ambulatory Visit
Admission: RE | Admit: 2022-04-05 | Discharge: 2022-04-05 | Disposition: A | Discharge status: Home / Self Care | Payer: Medicare Other | Source: Ambulatory Visit | Attending: Obstetrics and Gynecology | Admitting: Obstetrics and Gynecology

## 2022-04-05 DIAGNOSIS — Z1231 Encounter for screening mammogram for malignant neoplasm of breast: Secondary | ICD-10-CM

## 2022-04-07 ENCOUNTER — Other Ambulatory Visit: Payer: Self-pay | Admitting: Obstetrics and Gynecology

## 2022-04-07 DIAGNOSIS — R928 Other abnormal and inconclusive findings on diagnostic imaging of breast: Secondary | ICD-10-CM

## 2022-04-14 ENCOUNTER — Ambulatory Visit: Payer: Medicare Other

## 2022-04-14 ENCOUNTER — Ambulatory Visit
Admission: RE | Admit: 2022-04-14 | Discharge: 2022-04-14 | Disposition: A | Discharge status: Home / Self Care | Payer: Medicare Other | Source: Ambulatory Visit | Attending: Obstetrics and Gynecology | Admitting: Obstetrics and Gynecology

## 2022-04-14 DIAGNOSIS — R928 Other abnormal and inconclusive findings on diagnostic imaging of breast: Secondary | ICD-10-CM

## 2022-04-23 ENCOUNTER — Encounter (HOSPITAL_COMMUNITY): Payer: Self-pay

## 2022-04-23 ENCOUNTER — Emergency Department (HOSPITAL_COMMUNITY)
Admission: EM | Admit: 2022-04-23 | Discharge: 2022-04-23 | Disposition: A | Discharge status: Home / Self Care | Payer: Medicare Other | Attending: Emergency Medicine | Admitting: Emergency Medicine

## 2022-04-23 ENCOUNTER — Emergency Department (HOSPITAL_COMMUNITY): Payer: Medicare Other

## 2022-04-23 DIAGNOSIS — R0789 Other chest pain: Secondary | ICD-10-CM | POA: Insufficient documentation

## 2022-04-23 DIAGNOSIS — R1110 Vomiting, unspecified: Secondary | ICD-10-CM | POA: Insufficient documentation

## 2022-04-23 DIAGNOSIS — Z7982 Long term (current) use of aspirin: Secondary | ICD-10-CM | POA: Diagnosis not present

## 2022-04-23 DIAGNOSIS — R079 Chest pain, unspecified: Secondary | ICD-10-CM

## 2022-04-23 LAB — BASIC METABOLIC PANEL
Anion gap: 12 (ref 5–15)
BUN: 18 mg/dL (ref 6–20)
CO2: 26 mmol/L (ref 22–32)
Calcium: 9 mg/dL (ref 8.9–10.3)
Chloride: 97 mmol/L — ABNORMAL LOW (ref 98–111)
Creatinine, Ser: 1.02 mg/dL — ABNORMAL HIGH (ref 0.44–1.00)
GFR, Estimated: >60 mL/min (ref >60)
Glucose, Bld: 206 mg/dL — ABNORMAL HIGH (ref 70–99)
Potassium: 4.1 mmol/L (ref 3.5–5.1)
Sodium: 135 mmol/L (ref 135–145)

## 2022-04-23 LAB — CBC
HCT: 40.1 % (ref 36.0–46.0)
Hemoglobin: 12.5 g/dL (ref 12.0–15.0)
MCH: 26.3 pg (ref 26.0–34.0)
MCHC: 31.2 g/dL (ref 30.0–36.0)
MCV: 84.4 fL (ref 80.0–100.0)
Platelets: 285 10*3/uL (ref 150–400)
RBC: 4.75 MIL/uL (ref 3.87–5.11)
RDW: 13.2 % (ref 11.5–15.5)
WBC: 5 10*3/uL (ref 4.0–10.5)
nRBC: 0 % (ref 0.0–0.2)

## 2022-04-23 LAB — TROPONIN I (HIGH SENSITIVITY)
Troponin I (High Sensitivity): 4 ng/L (ref <18)
Troponin I (High Sensitivity): 5 ng/L (ref <18)

## 2022-04-23 MED ORDER — SODIUM CHLORIDE 0.9 % IV BOLUS
1000.0000 mL | Freq: Once | INTRAVENOUS | Status: AC
  Administered 2022-04-23: 1000 mL via INTRAVENOUS

## 2022-04-23 MED ORDER — PANTOPRAZOLE SODIUM 40 MG IV SOLR
40.0000 mg | Freq: Once | INTRAVENOUS | Status: AC
  Administered 2022-04-23: 40 mg via INTRAVENOUS
  Filled 2022-04-23: qty 10

## 2022-04-23 NOTE — ED Provider Notes (Signed)
[facility] Provider Note   CSN: [id number] Arrival date & time: 04/23/22  7169     History  Chief Complaint  Patient presents with   Chest Pain    Jill Foley is a 57 y.o. female.  Patient is a 57 year old female who presents with chest pain.  She said she woke up this morning around 5 AM and said she did not feel quite right.  She felt tingly all over.  She said she has felt like this before when she was told she was dehydrated.  Shortly after that she had a little bit of pressure in the center of her chest.  She took Tagamet and it was resolved with that.  She had lasted about 5 minutes.  She called EMS because she was not feeling quite right in the chest pain.  Her symptoms resolved after the Tagamet.  She did have 1 episode of vomiting after she tried to drink some Powerade.  No further nausea or vomiting.  No shortness of breath.  No diaphoresis.  No prior history of known heart disease.  She does have problems with reflux from time to time.  She was given aspirin by EMS.       Home Medications Prior to Admission medications   Medication Sig Start Date End Date Taking? Authorizing Provider  aspirin 81 MG chewable tablet Chew 81 mg by mouth daily.    [provider]  BIOTIN PO Take 10,000 Units by mouth daily.     [provider]  Calcium Carb-Cholecalciferol 600-800 MG-UNIT TABS Take 1 tablet by mouth daily.    [provider]  cholecalciferol (VITAMIN D3) 25 MCG (1000 UT) tablet Take 2,000 Units by mouth daily.    [provider]  COVID-19 mRNA Vac-TriS, Pfizer, SUSP injection Inject into the muscle. 09/27/20   Carlyle Basques, MD  ferrous sulfate 325 (65 FE) MG tablet Take by mouth.    [provider]  ketotifen (ALAWAY) 0.025 % ophthalmic solution Apply to eye. 05/04/15   [provider]  lisinopril-hydrochlorothiazide (PRINZIDE,ZESTORETIC) 20-12.5 MG per tablet Take 1  tablet by mouth daily.    [provider]  Magnesium Oxide 250 MG TABS Take 1 tablet by mouth daily.    [provider]  methylcellulose (ARTIFICIAL TEARS) 1 % ophthalmic solution Place 1 drop into both eyes 2 (two) times daily.    [provider]  Multiple Vitamin (MULTIVITAMIN) capsule Take 1 capsule by mouth daily.    [provider]  ondansetron (ZOFRAN ODT) 4 MG disintegrating tablet Take 1 tablet (4 mg total) by mouth every 8 (eight) hours as needed for nausea or vomiting. 12/01/20   Tedd Sias, PA  pravastatin (PRAVACHOL) 40 MG tablet Take 40 mg by mouth daily.    [provider]  sitaGLIPtin (JANUVIA) 50 MG tablet Take 50 mg by mouth daily.    [provider]      Allergies    Fish allergy, Ivp dye [iodinated contrast media], and Xalatan [latanoprost]    Review of Systems   Review of Systems  Constitutional:  Negative for chills, diaphoresis, fatigue and fever.  HENT:  Negative for congestion, rhinorrhea and sneezing.   Eyes: Negative.   Respiratory:  Negative for cough, chest tightness and shortness of breath.   Cardiovascular:  Positive for chest pain. Negative for leg swelling.  Gastrointestinal:  Positive for nausea and vomiting. Negative for abdominal pain, blood in stool and diarrhea.  Genitourinary:  Negative for difficulty urinating, flank pain, frequency and hematuria.  Musculoskeletal:  Negative for arthralgias and back pain.  Skin:  Negative for rash.  Neurological:  Negative for dizziness, speech difficulty, weakness, numbness and headaches.    Physical Exam Updated Vital Signs BP 139/75   Pulse 77   Resp 12   LMP 09/07/2010   SpO2 99%  Physical Exam Constitutional:      Appearance: She is well-developed.  HENT:     Head: Normocephalic and atraumatic.  Eyes:     Pupils: Pupils are equal, round, and reactive to light.  Cardiovascular:     Rate and Rhythm: Normal rate and regular rhythm.     Heart  sounds: Normal heart sounds.  Pulmonary:     Effort: Pulmonary effort is normal. No respiratory distress.     Breath sounds: Normal breath sounds. No wheezing or rales.  Chest:     Chest wall: No tenderness.  Abdominal:     General: Bowel sounds are normal.     Palpations: Abdomen is soft.     Tenderness: There is no abdominal tenderness. There is no guarding or rebound.  Musculoskeletal:        General: Normal range of motion.     Cervical back: Normal range of motion and neck supple.     Comments: No edema or calf tenderness  Lymphadenopathy:     Cervical: No cervical adenopathy.  Skin:    General: Skin is warm and dry.     Findings: No rash.  Neurological:     Mental Status: She is alert and oriented to person, place, and time.     ED Results / Procedures / Treatments   Labs (all labs ordered are listed, but only abnormal results are displayed) Labs Reviewed  BASIC METABOLIC PANEL - Abnormal; Notable for the following components:      Result Value   Chloride 97 (*)    Glucose, Bld 206 (*)    Creatinine, Ser 1.02 (*)    All other components within normal limits  CBC  TROPONIN I (HIGH SENSITIVITY)  TROPONIN I (HIGH SENSITIVITY)    EKG EKG Interpretation  Date/Time:  Sunday April 23 2022 08:54:17 EST Ventricular Rate:  89 PR Interval:  167 QRS Duration: 104 QT Interval:  371 QTC Calculation: 452 R Axis:   38 Text Interpretation: Sinus rhythm Low voltage, precordial leads since last tracing no significant change Confirmed by Rolan Bucco [phone]) on 04/23/2022 8:56:46 AM  Radiology DG Chest Port 1 View  Result Date: 04/23/2022 CLINICAL DATA:  Chest pain EXAM: PORTABLE CHEST 1 VIEW COMPARISON:  None Available. FINDINGS: The heart size and mediastinal contours are within normal limits. Both lungs are clear. The visualized skeletal structures are unremarkable. IMPRESSION: No active disease. Electronically Signed   By: Signa Kell M.D.   On: 04/23/2022 10:10     Procedures Procedures    Medications Ordered in ED Medications  sodium chloride 0.9 % bolus 1,000 mL (1,000 mLs Intravenous New Bag/Given 04/23/22 0907)  pantoprazole (PROTONIX) injection 40 mg (40 mg Intravenous Given 04/23/22 0907)    ED Course/ Medical Decision Making/ A&P                             Medical Decision Making Amount and/or Complexity of Data Reviewed Labs: ordered. Radiology: ordered.  Risk Prescription drug management.   Patient is a 57 year old who presents with chest pain.  It was  relieved with Tagamet.  She has had no further episodes.  No exertional symptoms.  No associated symptoms other than 1 episode of vomiting.  No shortness of breath.  Her EKG does not show any ischemic changes.  She has had 2 negative troponins.  Her other labs are nonconcerning.  No symptoms that sound more concerning for PE or aortic dissection.  Chest x-ray two-view showed no acute abnormalities.  No evidence of pneumonia or pneumothorax.  This was interpreted by me and confirmed by the radiologist.  She has been pain-free since arrival to the ED.  I suspect this may be GI in nature.  I advised her to take her an acid medication regularly for a few days and to follow-up this week with her primary care doctor.  Return precautions were given.  Final Clinical Impression(s) / ED Diagnoses Final diagnoses:  Nonspecific chest pain    Rx / DC Orders ED Discharge Orders     None         Malvin Johns, MD 04/23/22 1212

## 2022-04-23 NOTE — ED Triage Notes (Signed)
Pt BIB [facility] for c/o chest pain. Pt woke up at 5 AM with central chest pressure. States it felt similar to indigestion so she took OTC antacid, 324 aspirin PTA. Pt denies chest pain at this time.

## 2022-05-16 DIAGNOSIS — H6123 Impacted cerumen, bilateral: Secondary | ICD-10-CM | POA: Insufficient documentation

## 2022-11-13 ENCOUNTER — Other Ambulatory Visit: Payer: Self-pay

## 2022-11-13 ENCOUNTER — Encounter (HOSPITAL_BASED_OUTPATIENT_CLINIC_OR_DEPARTMENT_OTHER): Payer: Self-pay

## 2022-11-13 ENCOUNTER — Emergency Department (HOSPITAL_BASED_OUTPATIENT_CLINIC_OR_DEPARTMENT_OTHER)
Admission: EM | Admit: 2022-11-13 | Discharge: 2022-11-13 | Disposition: A | Discharge status: Home / Self Care | Payer: Medicare Other | Source: Home / Self Care | Attending: Emergency Medicine | Admitting: Emergency Medicine

## 2022-11-13 DIAGNOSIS — E119 Type 2 diabetes mellitus without complications: Secondary | ICD-10-CM | POA: Insufficient documentation

## 2022-11-13 DIAGNOSIS — I10 Essential (primary) hypertension: Secondary | ICD-10-CM | POA: Diagnosis not present

## 2022-11-13 DIAGNOSIS — R531 Weakness: Secondary | ICD-10-CM | POA: Diagnosis present

## 2022-11-13 DIAGNOSIS — Z79899 Other long term (current) drug therapy: Secondary | ICD-10-CM | POA: Diagnosis not present

## 2022-11-13 DIAGNOSIS — Z7982 Long term (current) use of aspirin: Secondary | ICD-10-CM | POA: Diagnosis not present

## 2022-11-13 LAB — URINALYSIS, ROUTINE W REFLEX MICROSCOPIC
Bilirubin Urine: NEGATIVE
Glucose, UA: NEGATIVE mg/dL
Hgb urine dipstick: NEGATIVE
Ketones, ur: NEGATIVE mg/dL
Leukocytes,Ua: NEGATIVE
Nitrite: NEGATIVE
Protein, ur: NEGATIVE mg/dL
Specific Gravity, Urine: 1.01 (ref 1.005–1.030)
pH: 5.5 (ref 5.0–8.0)

## 2022-11-13 LAB — CBC WITH DIFFERENTIAL/PLATELET
Abs Immature Granulocytes: 0.01 10*3/uL (ref 0.00–0.07)
Basophils Absolute: 0 10*3/uL (ref 0.0–0.1)
Basophils Relative: 0 %
Eosinophils Absolute: 0.2 10*3/uL (ref 0.0–0.5)
Eosinophils Relative: 4 %
HCT: 41.5 % (ref 36.0–46.0)
Hemoglobin: 13.2 g/dL (ref 12.0–15.0)
Immature Granulocytes: 0 %
Lymphocytes Relative: 33 %
Lymphs Abs: 1.7 10*3/uL (ref 0.7–4.0)
MCH: 26.3 pg (ref 26.0–34.0)
MCHC: 31.8 g/dL (ref 30.0–36.0)
MCV: 82.8 fL (ref 80.0–100.0)
Monocytes Absolute: 0.6 10*3/uL (ref 0.1–1.0)
Monocytes Relative: 11 %
Neutro Abs: 2.6 10*3/uL (ref 1.7–7.7)
Neutrophils Relative %: 52 %
Platelets: 302 10*3/uL (ref 150–400)
RBC: 5.01 MIL/uL (ref 3.87–5.11)
RDW: 13.5 % (ref 11.5–15.5)
WBC: 5.1 10*3/uL (ref 4.0–10.5)
nRBC: 0 % (ref 0.0–0.2)

## 2022-11-13 LAB — BASIC METABOLIC PANEL
Anion gap: 12 (ref 5–15)
BUN: 16 mg/dL (ref 6–20)
CO2: 29 mmol/L (ref 22–32)
Calcium: 9.1 mg/dL (ref 8.9–10.3)
Chloride: 99 mmol/L (ref 98–111)
Creatinine, Ser: 1.11 mg/dL — ABNORMAL HIGH (ref 0.44–1.00)
GFR, Estimated: 58 mL/min — ABNORMAL LOW (ref >60)
Glucose, Bld: 124 mg/dL — ABNORMAL HIGH (ref 70–99)
Potassium: 3.9 mmol/L (ref 3.5–5.1)
Sodium: 140 mmol/L (ref 135–145)

## 2022-11-13 MED ORDER — SODIUM CHLORIDE 0.9 % IV BOLUS
500.0000 mL | Freq: Once | INTRAVENOUS | Status: AC
  Administered 2022-11-13: 500 mL via INTRAVENOUS

## 2022-11-13 NOTE — ED Triage Notes (Signed)
Pt was POV - stated she has symptoms of "having equilibrium off a little - like a weakness"  since yesterday.  She also states her BP has been higher than normal BP - 174/90.

## 2022-11-13 NOTE — ED Provider Notes (Signed)
[facility] AT [facility]  Provider Note  CSN: [id number] Arrival date & time: 11/13/22 0033  History Chief Complaint  Patient presents with   Weakness    Jill Foley is a 57 y.o. female with history of HTN, DM reports two days of 'feeling icky', without any specific complaints. She states once she felt like her 'equilibrium was off' when she stood up, but lasted for only a few seconds and resolved. She denies any fever, N/V/D. No CP, SOB. She reports she has had similar symptoms in the past and told she was dehydrated, so she has been drinking more fluids without improvement. She has been taking her medications as prescribed but does not regularly check BP at home.    Home Medications Prior to Admission medications   Medication Sig Start Date End Date Taking? Authorizing Provider  aspirin 81 MG chewable tablet Chew 81 mg by mouth daily.    [provider]  BIOTIN PO Take 10,000 Units by mouth daily.     [provider]  Calcium Carb-Cholecalciferol 600-800 MG-UNIT TABS Take 1 tablet by mouth daily.    [provider]  cholecalciferol (VITAMIN D3) 25 MCG (1000 UT) tablet Take 2,000 Units by mouth daily.    [provider]  COVID-19 mRNA Vac-TriS, Pfizer, SUSP injection Inject into the muscle. 09/27/20   Judyann Munson, MD  ferrous sulfate 325 (65 FE) MG tablet Take by mouth.    [provider]  ketotifen (ALAWAY) 0.025 % ophthalmic solution Apply to eye. 05/04/15   [provider]  lisinopril-hydrochlorothiazide (PRINZIDE,ZESTORETIC) 20-12.5 MG per tablet Take 1 tablet by mouth daily.    [provider]  Magnesium Oxide 250 MG TABS Take 1 tablet by mouth daily.    [provider]  methylcellulose (ARTIFICIAL TEARS) 1 % ophthalmic solution Place 1 drop into both eyes 2 (two) times daily.    [provider]  Multiple Vitamin (MULTIVITAMIN) capsule Take 1 capsule by mouth  daily.    [provider]  ondansetron (ZOFRAN ODT) 4 MG disintegrating tablet Take 1 tablet (4 mg total) by mouth every 8 (eight) hours as needed for nausea or vomiting. 12/01/20   Gailen Shelter, PA  pravastatin (PRAVACHOL) 40 MG tablet Take 40 mg by mouth daily.    [provider]  sitaGLIPtin (JANUVIA) 50 MG tablet Take 50 mg by mouth daily.    [provider]     Allergies    Fish allergy, Ivp dye [iodinated contrast media], and Xalatan [latanoprost]   Review of Systems   Review of Systems Please see HPI for pertinent positives and negatives  Physical Exam BP 137/73   Pulse 76   Temp 98 F (36.7 C) (Oral)   Resp 18   Ht 5\' 10"  (1.778 m)   Wt 103.4 kg   LMP 09/07/2010   SpO2 100%   BMI 32.71 kg/m   Physical Exam Vitals and nursing note reviewed.  Constitutional:      Appearance: Normal appearance.  HENT:     Head: Normocephalic and atraumatic.     Nose: Nose normal.     Mouth/Throat:     Mouth: Mucous membranes are moist.  Eyes:     Extraocular Movements: Extraocular movements intact.     Conjunctiva/sclera: Conjunctivae normal.  Cardiovascular:     Rate and Rhythm: Normal rate.  Pulmonary:     Effort: Pulmonary effort is normal.     Breath sounds: Normal breath  sounds.  Abdominal:     General: Abdomen is flat.     Palpations: Abdomen is soft.     Tenderness: There is no abdominal tenderness.  Musculoskeletal:        General: No swelling. Normal range of motion.     Cervical back: Neck supple.  Skin:    General: Skin is warm and dry.  Neurological:     General: No focal deficit present.     Mental Status: She is alert and oriented to person, place, and time.     Cranial Nerves: No cranial nerve deficit.     Sensory: No sensory deficit.     Motor: No weakness.     Coordination: Coordination normal.     Gait: Gait normal.  Psychiatric:        Mood and Affect: Mood normal.     ED Results / Procedures / Treatments    EKG EKG Interpretation Date/Time:  Monday November 13 2022 01:10:24 EDT Ventricular Rate:  79 PR Interval:  167 QRS Duration:  94 QT Interval:  385 QTC Calculation: 442 R Axis:   45  Text Interpretation: Sinus rhythm Low voltage, precordial leads No significant change since last tracing Confirmed by Susy Frizzle [id number]) on 11/13/2022 1:23:50 AM  Procedures Procedures  Medications Ordered in the ED Medications  sodium chloride 0.9 % bolus 500 mL (500 mLs Intravenous New Bag/Given 11/13/22 0440)    Initial Impression and Plan  Patient here with vague symptoms she attributes to being dehydrated. Labs are unremarkable including CBC, BMP and UA. EKG is unremarkable. Will give saline bolus as patient reports this has helped her feel better in the past. Also noted to have increased BP which may also be contributing. Recommend she log BP at home the next few days and follow up with PCP for BP check later in the week.   ED Course       MDM Rules/Calculators/A&P Medical Decision Making Problems Addressed: Generalized weakness: acute illness or injury Uncontrolled hypertension: acute illness or injury  Amount and/or Complexity of Data Reviewed Labs: ordered. Decision-making details documented in ED Course. ECG/medicine tests: ordered and independent interpretation performed. Decision-making details documented in ED Course.  Risk Prescription drug management.     Final Clinical Impression(s) / ED Diagnoses Final diagnoses:  Generalized weakness  Uncontrolled hypertension    Rx / DC Orders ED Discharge Orders     None        Pollyann Savoy, MD 11/13/22 [phone]

## 2022-11-16 DIAGNOSIS — F4320 Adjustment disorder, unspecified: Secondary | ICD-10-CM | POA: Insufficient documentation

## 2023-03-13 ENCOUNTER — Other Ambulatory Visit: Payer: Self-pay | Admitting: Physician Assistant

## 2023-03-13 DIAGNOSIS — Z1231 Encounter for screening mammogram for malignant neoplasm of breast: Secondary | ICD-10-CM

## 2023-04-12 ENCOUNTER — Ambulatory Visit
Admission: RE | Admit: 2023-04-12 | Discharge: 2023-04-12 | Disposition: A | Discharge status: Home / Self Care | Payer: Medicare Other | Source: Ambulatory Visit | Attending: Obstetrics and Gynecology | Admitting: Obstetrics and Gynecology

## 2023-04-12 DIAGNOSIS — Z1231 Encounter for screening mammogram for malignant neoplasm of breast: Secondary | ICD-10-CM

## 2023-08-25 ENCOUNTER — Encounter (HOSPITAL_BASED_OUTPATIENT_CLINIC_OR_DEPARTMENT_OTHER): Payer: Self-pay | Admitting: Emergency Medicine

## 2023-08-25 ENCOUNTER — Emergency Department (HOSPITAL_BASED_OUTPATIENT_CLINIC_OR_DEPARTMENT_OTHER)

## 2023-08-25 ENCOUNTER — Other Ambulatory Visit: Payer: Self-pay

## 2023-08-25 ENCOUNTER — Observation Stay (HOSPITAL_BASED_OUTPATIENT_CLINIC_OR_DEPARTMENT_OTHER)
Admission: EM | Admit: 2023-08-25 | Discharge: 2023-08-26 | Disposition: A | Discharge status: Home / Self Care | Attending: Internal Medicine | Admitting: Internal Medicine

## 2023-08-25 DIAGNOSIS — R29818 Other symptoms and signs involving the nervous system: Secondary | ICD-10-CM | POA: Diagnosis present

## 2023-08-25 DIAGNOSIS — Z8673 Personal history of transient ischemic attack (TIA), and cerebral infarction without residual deficits: Secondary | ICD-10-CM | POA: Insufficient documentation

## 2023-08-25 DIAGNOSIS — H5462 Unqualified visual loss, left eye, normal vision right eye: Secondary | ICD-10-CM | POA: Diagnosis present

## 2023-08-25 DIAGNOSIS — E1140 Type 2 diabetes mellitus with diabetic neuropathy, unspecified: Secondary | ICD-10-CM

## 2023-08-25 DIAGNOSIS — Z8616 Personal history of COVID-19: Secondary | ICD-10-CM | POA: Insufficient documentation

## 2023-08-25 DIAGNOSIS — N183 Chronic kidney disease, stage 3 unspecified: Secondary | ICD-10-CM | POA: Diagnosis present

## 2023-08-25 DIAGNOSIS — I129 Hypertensive chronic kidney disease with stage 1 through stage 4 chronic kidney disease, or unspecified chronic kidney disease: Secondary | ICD-10-CM | POA: Diagnosis not present

## 2023-08-25 DIAGNOSIS — N1832 Chronic kidney disease, stage 3b: Secondary | ICD-10-CM | POA: Diagnosis not present

## 2023-08-25 DIAGNOSIS — D631 Anemia in chronic kidney disease: Secondary | ICD-10-CM | POA: Insufficient documentation

## 2023-08-25 DIAGNOSIS — R531 Weakness: Secondary | ICD-10-CM | POA: Diagnosis present

## 2023-08-25 DIAGNOSIS — E785 Hyperlipidemia, unspecified: Secondary | ICD-10-CM | POA: Diagnosis present

## 2023-08-25 DIAGNOSIS — H539 Unspecified visual disturbance: Secondary | ICD-10-CM | POA: Diagnosis not present

## 2023-08-25 DIAGNOSIS — Z7982 Long term (current) use of aspirin: Secondary | ICD-10-CM | POA: Insufficient documentation

## 2023-08-25 DIAGNOSIS — Z79899 Other long term (current) drug therapy: Secondary | ICD-10-CM | POA: Diagnosis not present

## 2023-08-25 DIAGNOSIS — H53462 Homonymous bilateral field defects, left side: Secondary | ICD-10-CM | POA: Diagnosis not present

## 2023-08-25 DIAGNOSIS — E1122 Type 2 diabetes mellitus with diabetic chronic kidney disease: Secondary | ICD-10-CM | POA: Diagnosis not present

## 2023-08-25 DIAGNOSIS — E11319 Type 2 diabetes mellitus with unspecified diabetic retinopathy without macular edema: Principal | ICD-10-CM

## 2023-08-25 DIAGNOSIS — I10 Essential (primary) hypertension: Secondary | ICD-10-CM | POA: Diagnosis not present

## 2023-08-25 DIAGNOSIS — N1831 Chronic kidney disease, stage 3a: Secondary | ICD-10-CM | POA: Insufficient documentation

## 2023-08-25 DIAGNOSIS — E119 Type 2 diabetes mellitus without complications: Secondary | ICD-10-CM

## 2023-08-25 DIAGNOSIS — E7849 Other hyperlipidemia: Secondary | ICD-10-CM

## 2023-08-25 LAB — COMPREHENSIVE METABOLIC PANEL WITH GFR
ALT: 31 U/L (ref 0–44)
AST: 35 U/L (ref 15–41)
Albumin: 4.3 g/dL (ref 3.5–5.0)
Alkaline Phosphatase: 96 U/L (ref 38–126)
Anion gap: 12 (ref 5–15)
BUN: 18 mg/dL (ref 6–20)
CO2: 29 mmol/L (ref 22–32)
Calcium: 10 mg/dL (ref 8.9–10.3)
Chloride: 100 mmol/L (ref 98–111)
Creatinine, Ser: 1.08 mg/dL — ABNORMAL HIGH (ref 0.44–1.00)
GFR, Estimated: 59 mL/min — ABNORMAL LOW (ref >60)
Glucose, Bld: 124 mg/dL — ABNORMAL HIGH (ref 70–99)
Potassium: 4.3 mmol/L (ref 3.5–5.1)
Sodium: 141 mmol/L (ref 135–145)
Total Bilirubin: 0.3 mg/dL (ref 0.0–1.2)
Total Protein: 7.6 g/dL (ref 6.5–8.1)

## 2023-08-25 LAB — CBC
HCT: 44.6 % (ref 36.0–46.0)
Hemoglobin: 14.1 g/dL (ref 12.0–15.0)
MCH: 25.9 pg — ABNORMAL LOW (ref 26.0–34.0)
MCHC: 31.6 g/dL (ref 30.0–36.0)
MCV: 81.8 fL (ref 80.0–100.0)
Platelets: 300 10*3/uL (ref 150–400)
RBC: 5.45 MIL/uL — ABNORMAL HIGH (ref 3.87–5.11)
RDW: 14 % (ref 11.5–15.5)
WBC: 4.8 10*3/uL (ref 4.0–10.5)
nRBC: 0 % (ref 0.0–0.2)

## 2023-08-25 LAB — PROTIME-INR
INR: 0.9 (ref 0.8–1.2)
Prothrombin Time: 12.7 s (ref 11.4–15.2)

## 2023-08-25 LAB — APTT: aPTT: 28 s (ref 24–36)

## 2023-08-25 LAB — DIFFERENTIAL
Abs Immature Granulocytes: 0.01 10*3/uL (ref 0.00–0.07)
Basophils Absolute: 0 10*3/uL (ref 0.0–0.1)
Basophils Relative: 1 %
Eosinophils Absolute: 0.2 10*3/uL (ref 0.0–0.5)
Eosinophils Relative: 4 %
Immature Granulocytes: 0 %
Lymphocytes Relative: 25 %
Lymphs Abs: 1.2 10*3/uL (ref 0.7–4.0)
Monocytes Absolute: 0.6 10*3/uL (ref 0.1–1.0)
Monocytes Relative: 13 %
Neutro Abs: 2.7 10*3/uL (ref 1.7–7.7)
Neutrophils Relative %: 57 %

## 2023-08-25 LAB — ETHANOL: Alcohol, Ethyl (B): <15 mg/dL (ref <15)

## 2023-08-25 LAB — GLUCOSE, CAPILLARY: Glucose-Capillary: 145 mg/dL — ABNORMAL HIGH (ref 70–99)

## 2023-08-25 MED ORDER — CLOPIDOGREL BISULFATE 300 MG PO TABS
300.0000 mg | ORAL_TABLET | Freq: Once | ORAL | Status: AC
  Administered 2023-08-25: 300 mg via ORAL
  Filled 2023-08-25: qty 1

## 2023-08-25 MED ORDER — ASPIRIN 325 MG PO TABS
325.0000 mg | ORAL_TABLET | Freq: Every day | ORAL | Status: DC
  Administered 2023-08-25: 325 mg via ORAL
  Filled 2023-08-25: qty 1

## 2023-08-25 MED ORDER — CLOPIDOGREL BISULFATE 75 MG PO TABS: 75.0000 mg | ORAL_TABLET | Freq: Every day | ORAL | Status: DC

## 2023-08-25 MED ORDER — INSULIN ASPART 100 UNIT/ML IJ SOLN: 0.0000 [IU] | Freq: Three times a day (TID) | INTRAMUSCULAR | Status: DC

## 2023-08-25 MED ORDER — ACETAMINOPHEN 160 MG/5ML PO SOLN: 650.0000 mg | ORAL | Status: DC | PRN

## 2023-08-25 MED ORDER — ACETAMINOPHEN 650 MG RE SUPP: 650.0000 mg | RECTAL | Status: DC | PRN

## 2023-08-25 MED ORDER — STROKE: EARLY STAGES OF RECOVERY BOOK
Freq: Once | Status: AC
  Filled 2023-08-25: qty 1

## 2023-08-25 MED ORDER — ASPIRIN 81 MG PO CHEW: 81.0000 mg | CHEWABLE_TABLET | Freq: Every day | ORAL | Status: DC

## 2023-08-25 MED ORDER — ACETAMINOPHEN 325 MG PO TABS: 650.0000 mg | ORAL_TABLET | ORAL | Status: DC | PRN

## 2023-08-25 MED ORDER — ENOXAPARIN SODIUM 40 MG/0.4ML IJ SOSY
40.0000 mg | PREFILLED_SYRINGE | INTRAMUSCULAR | Status: DC
  Administered 2023-08-26: 40 mg via SUBCUTANEOUS
  Filled 2023-08-25: qty 0.4

## 2023-08-25 MED ORDER — SODIUM CHLORIDE 0.9 % IV BOLUS
1000.0000 mL | Freq: Once | INTRAVENOUS | Status: AC
  Administered 2023-08-25: 1000 mL via INTRAVENOUS

## 2023-08-25 MED ORDER — SODIUM CHLORIDE 0.9% FLUSH
3.0000 mL | Freq: Two times a day (BID) | INTRAVENOUS | Status: DC
  Administered 2023-08-25 – 2023-08-26 (×2): 10 mL via INTRAVENOUS

## 2023-08-25 MED ORDER — ASPIRIN 325 MG PO TABS: 325.0000 mg | ORAL_TABLET | Freq: Once | ORAL | Status: DC

## 2023-08-25 MED ORDER — INSULIN ASPART 100 UNIT/ML IJ SOLN: 0.0000 [IU] | Freq: Every day | INTRAMUSCULAR | Status: DC

## 2023-08-25 MED ORDER — SODIUM CHLORIDE 0.9% FLUSH: 3.0000 mL | INTRAVENOUS | Status: DC | PRN

## 2023-08-25 MED ORDER — PRAVASTATIN SODIUM 40 MG PO TABS
40.0000 mg | ORAL_TABLET | Freq: Every day | ORAL | Status: DC
Start: 2023-08-26 — End: 2023-08-26
  Administered 2023-08-26: 40 mg via ORAL
  Filled 2023-08-25: qty 1

## 2023-08-25 NOTE — H&P (Signed)
 History and Physical    Jill Foley RUE:[id number] DOB: 1965-10-09 DOA: 08/25/2023  PCP: Arch Beans, PA-C   Patient coming from: Home   Chief Complaint:  Chief Complaint  Patient presents with   Weakness   ED TRIAGE note:  Pt reports she has fatigue and general weakness on Thurs; sts she feels fine now, but wanted to be checked out d/t her hx of TIA; thinks it could have been dehydration    HPI:  Jill Foley is a 58 y.o. female with medical history significant of essential hypertension, hyperlipidemia, DM type II, diabetic retinopathy, CKD stage 3 And TIA presented emergency department complaining of feeling diffusely weak since 5/22.  Patient also noticed that segment of the left-sided visual field is cloudy.  Patient reported she cannot see out of her right eye chronically.  Patient reported she thought that left-sided visual field will improve however it has not so she came to the ED for evaluation for visual field defect and diffuse weakness. Patient reported that she has ongoing left-sided lateral visual field deficit and eye tightness for last 2 to 3 days.  Patient denies any headache, focal, generalized weakness, sensory change, tingling, numbness, seizure, loss of consciousness, syncope and presyncope.  Denies any chest pain, shortness of breath or lower extremity swelling.  No other complaint at this time.  ED Course:  At presentation to ED patient is hemodynamically stable. CMP unremarkable stable renal function. CBC unremarkable. Normal pro time INR. Blood alcohol level within normal range. While patient in the [facility] ED had a CT head which did not show any acute intracranial abnormality.   In the ED patient has been treated with aspirin  and Plavix load.  Also received NS bolus.  Patient has been transferred to [facility] for evaluation for MRI and rule out stroke.  ED physician discussed with neurology Dr. Doretta Gant recommended  admission for stroke workup.  Hospitalist has been consulted for management for rule out of stroke in the setting of generalized weakness and visual field defect.  Significant labs in the ED: Lab Orders         Ethanol         Protime-INR         APTT         CBC         Differential         Comprehensive metabolic panel         Lipid panel         Hemoglobin A1c         CBC         Comprehensive metabolic panel         HIV Antibody (routine testing w rflx)         Glucose, capillary       Review of Systems:  Review of Systems  Constitutional:  Negative for chills, fever and weight loss.  HENT:  Negative for hearing loss.   Eyes:  Positive for blurred vision. Negative for double vision, photophobia, pain, discharge and redness.       Right-sided chronic vision loss.  Left-sided partial/lateral blurry vision.  Respiratory:  Negative for cough.   Cardiovascular:  Negative for chest pain and palpitations.  Musculoskeletal:  Negative for myalgias.  Neurological:  Negative for dizziness, tingling, tremors, sensory change, speech change, focal weakness, seizures, loss of consciousness, weakness and headaches.  Psychiatric/Behavioral:  The patient is not nervous/anxious.     Past Medical History:  Diagnosis Date   Achalasia    Anemia    Diabetes mellitus    Hypercholesteremia    Hypertension    Legally blind    Seasonal allergies    Sinus infection    Stroke [facility]) 2013   TIA    Past Surgical History:  Procedure Laterality Date   BREAST BIOPSY Left 09/10/2015   Procedure: EXCISION CYST LEFT BREAST;  Surgeon: Lillette Reid III, MD;  Location: [facility];  Service: General;  Laterality: Left;  EXCISION CYST LEFT BREAST   BREAST BIOPSY Right 2008   benign   BREAST EXCISIONAL BIOPSY Left    benign   BREAST SURGERY Bilateral    Biopsy,LFT 2000,Right 2008   EYE SURGERY  2000   cataract removal with lens implants, both eyes   SKIN BIOPSY     VITRECTOMY  lft   1999, Right2002   lft  1999, Right2002     reports that she has never smoked. She has never used smokeless tobacco. She reports that she does not drink alcohol and does not use drugs.  Allergies  Allergen Reactions   Fish Allergy Itching   Ivp Dye [Iodinated Contrast Media] Itching   Xalatan [Latanoprost]     Family History  Problem Relation Age of Onset   Hypertension Mother    Diabetes Mother    Heart Problems Mother    Stroke Mother    Hypertension Father    Glaucoma Father    Diabetes Other    Hypertension Other    Hyperlipidemia Other    Breast cancer Paternal Aunt    Sudden death Neg Hx    Heart attack Neg Hx     Prior to Admission medications   Medication Sig Start Date End Date Taking? Authorizing Provider  aspirin  81 MG chewable tablet Chew 81 mg by mouth daily.    [provider]  BIOTIN PO Take 10,000 Units by mouth daily.     [provider]  Calcium Carb-Cholecalciferol 600-800 MG-UNIT TABS Take 1 tablet by mouth daily.    [provider]  cholecalciferol (VITAMIN D3) 25 MCG (1000 UT) tablet Take 2,000 Units by mouth daily.    [provider]  COVID-19 mRNA Vac-TriS, Pfizer, SUSP injection Inject into the muscle. 09/27/20   Liane Redman, MD  ferrous sulfate 325 (65 FE) MG tablet Take by mouth.    [provider]  ketotifen (ALAWAY) 0.025 % ophthalmic solution Apply to eye. 05/04/15   [provider]  lisinopril-hydrochlorothiazide (PRINZIDE,ZESTORETIC) 20-12.5 MG per tablet Take 1 tablet by mouth daily.    [provider]  Magnesium Oxide 250 MG TABS Take 1 tablet by mouth daily.    [provider]  methylcellulose (ARTIFICIAL TEARS) 1 % ophthalmic solution Place 1 drop into both eyes 2 (two) times daily.    [provider]  Multiple Vitamin (MULTIVITAMIN) capsule Take 1 capsule by mouth daily.    [provider]  ondansetron  (ZOFRAN  ODT) 4 MG disintegrating tablet Take 1  tablet (4 mg total) by mouth every 8 (eight) hours as needed for nausea or vomiting. 12/01/20   Coretta Dexter, PA  pravastatin  (PRAVACHOL ) 40 MG tablet Take 40 mg by mouth daily.    [provider]  sitaGLIPtin (JANUVIA) 50 MG tablet Take 50 mg by mouth daily.    [provider]     Physical Exam: Vitals:   08/25/23 1952 08/25/23 2045 08/26/23 0001 08/26/23 0404  BP: (!) 143/79 (!) 160/74 Aaron Aas)  140/71 136/68  Pulse: 97 88 86 80  Resp: 18 18 18    Temp: 98 F (36.7 C) 98.2 F (36.8 C) 97.7 F (36.5 C) 97.8 F (36.6 C)  TempSrc: Oral Oral Oral Oral  SpO2: 100% 100% 99% 99%  Weight:      Height:        Physical Exam Constitutional:      General: She is not in acute distress.    Appearance: She is not ill-appearing.  HENT:     Head: Normocephalic.     Nose: Nose normal.     Mouth/Throat:     Mouth: Mucous membranes are moist.  Eyes:     Pupils: Pupils are equal, round, and reactive to light.  Cardiovascular:     Rate and Rhythm: Normal rate and regular rhythm.     Pulses: Normal pulses.     Heart sounds: Normal heart sounds.  Pulmonary:     Effort: Pulmonary effort is normal.     Breath sounds: Normal breath sounds.  Abdominal:     General: Bowel sounds are normal.  Musculoskeletal:     Cervical back: Neck supple.  Skin:    Capillary Refill: Capillary refill takes less than 2 seconds.  Neurological:     Mental Status: She is alert and oriented to person, place, and time.     Cranial Nerves: No cranial nerve deficit.     Sensory: No sensory deficit.     Motor: No weakness.     Coordination: Coordination normal.     Gait: Gait normal.     Deep Tendon Reflexes: Reflexes normal.     Comments: Right-sided chronic vision loss. Left-sided visual field lateral visual field deficit.   Psychiatric:        Mood and Affect: Mood normal.        Thought Content: Thought content normal.      Labs on Admission: I have personally reviewed following labs and  imaging studies  CBC: Recent Labs  Lab 08/25/23 1219  WBC 4.8  NEUTROABS 2.7  HGB 14.1  HCT 44.6  MCV 81.8  PLT 300   Basic Metabolic Panel: Recent Labs  Lab 08/25/23 1219  NA 141  K 4.3  CL 100  CO2 29  GLUCOSE 124*  BUN 18  CREATININE 1.08*  CALCIUM 10.0   GFR: Estimated Creatinine Clearance: 72.2 mL/min (A) (by C-G formula based on SCr of 1.08 mg/dL (H)). Liver Function Tests: Recent Labs  Lab 08/25/23 1219  AST 35  ALT 31  ALKPHOS 96  BILITOT 0.3  PROT 7.6  ALBUMIN 4.3   No results for input(s): "LIPASE", "AMYLASE" in the last 168 hours. No results for input(s): "AMMONIA" in the last 168 hours. Coagulation Profile: Recent Labs  Lab 08/25/23 1219  INR 0.9   Cardiac Enzymes: No results for input(s): "CKTOTAL", "CKMB", "CKMBINDEX", "TROPONINI", "TROPONINIHS" in the last 168 hours. BNP (last 3 results) No results for input(s): "BNP" in the last 8760 hours. HbA1C: No results for input(s): "HGBA1C" in the last 72 hours. CBG: Recent Labs  Lab 08/25/23 2128  GLUCAP 145*   Lipid Profile: No results for input(s): "CHOL", "HDL", "LDLCALC", "TRIG", "CHOLHDL", "LDLDIRECT" in the last 72 hours. Thyroid Function Tests: No results for input(s): "TSH", "T4TOTAL", "FREET4", "T3FREE", "THYROIDAB" in the last 72 hours. Anemia Panel: No results for input(s): "VITAMINB12", "FOLATE", "FERRITIN", "TIBC", "IRON", "RETICCTPCT" in the last 72 hours. Urine analysis:    Component Value Date/Time   COLORURINE STRAW (A) 11/13/2022 0113  APPEARANCEUR CLEAR 11/13/2022 0113   LABSPEC 1.010 11/13/2022 0113   PHURINE 5.5 11/13/2022 0113   GLUCOSEU NEGATIVE 11/13/2022 0113   HGBUR NEGATIVE 11/13/2022 0113   BILIRUBINUR NEGATIVE 11/13/2022 0113   KETONESUR NEGATIVE 11/13/2022 0113   PROTEINUR NEGATIVE 11/13/2022 0113   UROBILINOGEN 0.2 10/20/2011 1324   NITRITE NEGATIVE 11/13/2022 0113   LEUKOCYTESUR NEGATIVE 11/13/2022 0113    Radiological Exams on Admission: I have  personally reviewed images MR BRAIN WO CONTRAST Result Date: 08/26/2023 CLINICAL DATA:  Rule out a stroke EXAM: MRI HEAD WITHOUT CONTRAST TECHNIQUE: Multiplanar, multiecho pulse sequences of the brain and surrounding structures were obtained without intravenous contrast. COMPARISON:  CT head May 24, 25. MRI head 11/04/2011 FINDINGS: Brain: No acute infarction, hemorrhage, hydrocephalus, extra-axial collection or mass lesion. Mild scattered T2/FLAIR hyperintensities in the white matter, which are nonspecific but compatible with chronic microvascular ischemic disease. Vascular: Normal flow voids. Skull and upper cervical spine: Normal marrow signal. Sinuses/Orbits: Clear sinuses.  No acute orbital findings. Other: No mastoid effusions. IMPRESSION: No evidence of acute intracranial abnormality. Electronically Signed   By: Stevenson Elbe M.D.   On: 08/26/2023 03:51   CT HEAD WO CONTRAST Result Date: 08/25/2023 CLINICAL DATA:  Neuro deficit, acute, stroke suspected EXAM: CT HEAD WITHOUT CONTRAST TECHNIQUE: Contiguous axial images were obtained from the base of the skull through the vertex without intravenous contrast. RADIATION DOSE REDUCTION: This exam was performed according to the departmental dose-optimization program which includes automated exposure control, adjustment of the mA and/or kV according to patient size and/or use of iterative reconstruction technique. COMPARISON:  Head CT 03/24/2018 FINDINGS: Brain: No intracranial hemorrhage, mass effect, or midline shift. No hydrocephalus. The basilar cisterns are patent. No evidence of territorial infarct or acute ischemia. No extra-axial or intracranial fluid collection. Vascular: Atherosclerosis of skullbase vasculature without hyperdense vessel or abnormal calcification. Skull: No fracture or focal lesion. Sinuses/Orbits: No acute finding. Chronic hyperdensity in the right globe. Other: None. IMPRESSION: No acute intracranial abnormality. Electronically  Signed   By: Chadwick Colonel M.D.   On: 08/25/2023 14:22     EKG: My personal interpretation of EKG shows: Normal sinus rhythm heart rate 85.    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  History of right-sided chronic visual loss. - At presentation to ED patient is hemodynamically stable - Lab work unremarkable - CT head no evidence of acute intracranial abnormality/stroke. - ED physician discussed case with neurology Dr. Doretta Gant who recommended admit patient for stroke workup.   -In the ED patient has been given aspirin  325 mg and Plavix 300 mg. -Spoke with on-call neurology Dr. Murvin Arthurs who recommended to obtain the MRI first and based on the MRI finding will decide further treatment care.  Recommended to continue aspirin  and Plavix. - Given patient has history of diabetic retinopathy associated chronic right-sided visual field loss and currently now having left-sided visual field blurriness as well recommended to reach out to ophthalmology in the daytime for evaluation. -  Obtaining MRI.  Patient is allergic to contrast.  Instead of due to doing CTA of the head and neck obtaining bilateral carotid ultrasound.  Checking echocardiogram - Continue neurocheck every 4 hours. - Continue aspirin  81 mg daily and Plavix 75 mg daily from tomorrow.   -Continue statin. - Check A1c and lipid  panel. - Continue cardiac monitoring. - Continue permissive hypertension for next 24 hours. -Consulted PT and OT. Addendum - Consulted ophthalmology Dr. Lacy Pick and spoke over phone.  Will see patient in the morning. -Neurology evaluated patient recommended to hold aspirin  and Plavix and follow-up with MRI result first if it is actual stroke and wait for ophthalmology evaluation before continuation of aspirin  and Plavix. Addendum - MRI no evidence of CVA.  As MRI ruled out a stroke not pursuing further stroke workup and discontinuing cavitate ultrasound and echocardiogram. -At this point will await for ophthalmology evaluation. -Given patient has previous history of TIA continue aspirin  81 mg daily.  Essential hypertension -Blood pressure within good range.  Continue permissive hypertension for next 24 hours.  Hyperlipidemia -Continue statin  Non-insulin-dependent DM type II -Rechecking A1c level.  Continue carb modified diet with sliding scale insulin coverage.  CKD stage IIIa -Renal function at baseline.  Continue to monitor.    DVT prophylaxis:  Lovenox  Code Status:  Full Code Diet: Heart healthy carb modified Family Communication:   Family was present at bedside, at the time of interview. Opportunity was given to ask question and all questions were answered satisfactorily.  Disposition Plan: Pending MRI result and bilateral carotid ultrasound. Consults: Neurology, PT and OT Admission status:   Observation, Telemetry bed  Severity of Illness: The appropriate patient status for this patient is OBSERVATION. Observation status is judged to be reasonable and necessary in order to provide the required intensity of service to ensure the patient's safety. The patient's presenting symptoms, physical exam findings, and initial radiographic and laboratory data in the context of their medical condition is felt to place them at decreased risk for further clinical deterioration. Furthermore,  it is anticipated that the patient will be medically stable for discharge from the hospital within 2 midnights of admission.     Sabian Kuba, MD [facility]  How to contact the [facility] Attending or Consulting provider 7A - 7P or covering provider during after hours 7P -7A, for this patient.  Check the care team in [facility] and look for a) attending/consulting [facility] provider listed and b) the [facility] team listed Log into [email or web address] and use [facility] universal password to access. If you do not have the password, please contact the hospital operator. Locate the [facility] provider you are looking for under [facility] and page to a number that you can be directly reached. If you still have difficulty reaching the provider, please page the [facility] (Director on Call) for the Hospitalists listed on amion for assistance.  08/26/2023, 6:02 AM

## 2023-08-25 NOTE — ED Notes (Signed)
 [facility] called at 7:11p for transport. Spoke with Kianna.

## 2023-08-25 NOTE — ED Triage Notes (Signed)
 Pt reports she has fatigue and general weakness on Thurs; sts she feels fine now, but wanted to be checked out d/t her hx of TIA; thinks it could have been dehydration

## 2023-08-25 NOTE — Consult Note (Incomplete)
 NEUROLOGY CONSULT NOTE   Date of service: Aug 25, 2023 Patient Name: Jill Foley MRN:  [id number] DOB:  1965/10/26 Chief Complaint: "Blurred vision in the left" Requesting Provider: Sundil, Subrina, MD  History of Present Illness  Jill Foley is a 58 y.o. 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  Patient reports she has no vision in her right eye.  She reports that on Thursday she had fatigue and generalized weakness.  She thought she was dehydrated.  She drank a lot of electrolyte rich water and felt significantly improved on Friday and was no longer feeling fatigued and weak.  However, she noticed that her vision was blurry on the extreme left corner of her vision from her left eye.  In addition, she also reports some tightness in the left eye but denies any left eye pain.  She initially presented to [facility] emergency department where she was evaluated.  Case was discussed with neurology.  However, it does not seem that this was ever discussed with ophthalmology despite her extensive history of diabetic retinopathy affecting both eyes.  Patient endorses family history of stroke in her mother.  She does not smoke, does not drink alcohol, does not use any recreational substances.  Patient had a CT head without contrast which was negative for acute intracranial abnormality.   ROS  Comprehensive ROS performed and pertinent positives documented in HPI   Past History   Past Medical History:  Diagnosis Date   Achalasia    Anemia    Diabetes mellitus    Hypercholesteremia    Hypertension    Legally blind    Seasonal allergies    Sinus infection    Stroke [facility]) 2013   TIA    Past Surgical History:  Procedure Laterality Date   BREAST BIOPSY Left 09/10/2015    Procedure: EXCISION CYST LEFT BREAST;  Surgeon: Lillette Reid III, MD;  Location: [facility];  Service: General;  Laterality: Left;  EXCISION CYST LEFT BREAST   BREAST BIOPSY Right 2008   benign   BREAST EXCISIONAL BIOPSY Left    benign   BREAST SURGERY Bilateral    Biopsy,LFT 2000,Right 2008   EYE SURGERY  2000   cataract removal with lens implants, both eyes   SKIN BIOPSY     VITRECTOMY  lft  1999, Right2002   lft  1999, Right2002    Family History: Family History  Problem Relation Age of Onset   Hypertension Mother    Diabetes Mother    Heart Problems Mother    Stroke Mother    Hypertension Father    Glaucoma Father    Diabetes Other    Hypertension Other    Hyperlipidemia Other    Breast cancer Paternal Aunt    Sudden death Neg Hx    Heart attack Neg Hx     Social History  reports that she has never smoked. She has never used smokeless tobacco. She reports that she does not drink alcohol and does not use drugs.  Allergies  Allergen Reactions   Fish Allergy Itching   Ivp Dye [Iodinated Contrast Media] Itching   Xalatan [Latanoprost]     Medications   Current Facility-Administered Medications:    [START ON 08/26/2023]  stroke: early stages of recovery book, ,  Does not apply, Once, Sundil, Subrina, MD   acetaminophen  (TYLENOL ) tablet 650 mg, 650 mg, Oral, Q4H PRN **OR** acetaminophen  (TYLENOL ) 160 MG/5ML solution 650 mg, 650 mg, Per Tube, Q4H PRN **OR** acetaminophen  (TYLENOL ) suppository 650 mg, 650 mg, Rectal, Q4H PRN, Sundil, Subrina, MD   [START ON 08/26/2023] aspirin  chewable tablet 81 mg, 81 mg, Oral, Daily, Sundil, Subrina, MD   Jill Foley ON 08/26/2023] clopidogrel (PLAVIX) tablet 75 mg, 75 mg, Oral, Daily, Sundil, Subrina, MD   [START ON 08/26/2023] enoxaparin  (LOVENOX ) injection 40 mg, 40 mg, Subcutaneous, Q24H, Sundil, Subrina, MD   insulin aspart (novoLOG) injection 0-5 Units, 0-5 Units, Subcutaneous, QHS, Sundil, Subrina, MD   Jill Foley ON 08/26/2023]  insulin aspart (novoLOG) injection 0-6 Units, 0-6 Units, Subcutaneous, TID WC, Sundil, Subrina, MD   Jill Foley ON 08/26/2023] pravastatin (PRAVACHOL) tablet 40 mg, 40 mg, Oral, Daily, Sundil, Subrina, MD   sodium chloride  flush (NS) 0.9 % injection 3-10 mL, 3-10 mL, Intravenous, Q12H, Sundil, Subrina, MD, 10 mL at 08/25/23 2155   sodium chloride  flush (NS) 0.9 % injection 3-10 mL, 3-10 mL, Intravenous, PRN, Sundil, Subrina, MD  Vitals   Vitals:   08/25/23 1745 08/25/23 1800 08/25/23 1952 08/25/23 2045  BP: (!) 144/70 (!) 144/69 (!) 143/79 (!) 160/74  Pulse:  100 97 88  Resp: 13 18 18 18   Temp:  97.9 F (36.6 C) 98 F (36.7 C) 98.2 F (36.8 C)  TempSrc:  Oral Oral Oral  SpO2:  100% 100% 100%  Weight:      Height:        Body mass index is 31.14 kg/m.  Physical Exam   General: Laying comfortably in bed; in no acute distress.  HENT: Normal oropharynx and mucosa. Normal external appearance of ears and nose.  Neck: Supple, no pain or tenderness  CV: No JVD. No peripheral edema.  Pulmonary: Symmetric Chest rise. Normal respiratory effort.  Abdomen: Soft to touch, non-tender.  Ext: No cyanosis, edema, or deformity  Skin: No rash. Normal palpation of skin.   Musculoskeletal: Normal digits and nails by inspection. No clubbing.   Neurologic Examination  Mental status/Cognition: Alert, oriented to self, place, month and year, good attention.  Speech/language: Fluent, comprehension intact, object naming intact, repetition intact.  Cranial nerves:   CN II Blind in R eye, L pupil is oblong and unreactive to light, R pupil is irregular and unreactive to light. Tunneled vision in  L eye with decreased peripheral visual field in all directions.   CN III,IV,VI EOM intact, no gaze preference or deviation, no nystagmus    CN V normal sensation in V1, V2, and V3 segments bilaterally    CN VII no asymmetry, no nasolabial fold flattening    CN VIII normal hearing to speech    CN IX & X normal  palatal elevation, no uvular deviation    CN XI 5/5 head turn and 5/5 shoulder shrug bilaterally    CN XII midline tongue protrusion    Motor:  Muscle bulk: Normal, tone normal, pronator drift none tremor none Mvmt Root Nerve  Muscle Right Left Comments  SA C5/6 Ax Deltoid 5 5   EF C5/6 Mc Biceps 5 5   EE C6/7/8 Rad Triceps 5 5   WF C6/7 Med FCR     WE C7/8 PIN ECU     F Ab C8/T1 U ADM/FDI 5 5   HF L1/2/3 Fem Illopsoas 5 5   KE L2/3/4 Fem Quad 5 5   DF L4/5 D Peron  Tib Ant 5 5   PF S1/2 Tibial Grc/Sol 5 5    Sensation:  Light touch Intact throughout   Pin prick    Temperature    Vibration   Proprioception    Coordination/Complex Motor:  - Finger to Nose intact bilaterally - Heel to shin intact bilaterally - Rapid alternating movement are normal - Gait: Deferred for patient's safety. Labs/Imaging/Neurodiagnostic studies   CBC:  Recent Labs  Lab Aug 26, 2023 1219  WBC 4.8  NEUTROABS 2.7  HGB 14.1  HCT 44.6  MCV 81.8  PLT 300   Basic Metabolic Panel:  Lab Results  Component Value Date   NA 141 08/26/23   K 4.3 2023/08/26   CO2 29 08/26/23   GLUCOSE 124 (H) 08-26-2023   BUN 18 2023/08/26   CREATININE 1.08 (H) 2023/08/26   CALCIUM 10.0 08/26/23   GFRNONAA 59 (L) 08/26/23   GFRAA 56 (L) 09/08/2015   Lipid Panel:  Lab Results  Component Value Date   LDLCALC 62 10/21/2011   HgbA1c:  Lab Results  Component Value Date   HGBA1C 6.5 (H) 10/20/2011   Urine Drug Screen: No results found for: "LABOPIA", "COCAINSCRNUR", "LABBENZ", "AMPHETMU", "THCU", "LABBARB"  Alcohol Level     Component Value Date/Time   ETH <15 08/26/2023 1219   INR  Lab Results  Component Value Date   INR 0.9 08-26-2023   APTT  Lab Results  Component Value Date   APTT 28 Aug 26, 2023   AED levels: No results found for: "PHENYTOIN", "ZONISAMIDE", "LAMOTRIGINE", "LEVETIRACETA"  CT Head without contrast(Personally reviewed): CTH was negative for a large hypodensity concerning  for a large territory infarct or hyperdensity concerning for an ICH  MRI Brain(Personally reviewed): Pending  ASSESSMENT   Jill Foley is a 58 y.o. 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  Patient reports she has no vision in her right eye.    It is hard to differentiate on exam whether she has monocular vision deficit or that this is a hemianopsia.  This is difficult especially given her right eye complete visual loss.  She also has significant ocular history with reported diabetic retinopathy involving both eyes.  I would recommend starting with a MRI of the brain without contrast.  If it demonstrates a stroke, then I would recommend full stroke workup.  I would recommend discussing her presentation with ophthalmology and having ophthalmology evaluate her.  If the patient has CRAO/BRAO, would also benefit from inpatient stroke workup.  RECOMMENDATIONS  - MRI brain without contrast. - Optho evaluation. - I discontinued Antiplatelet at this time. If this is a stroke, please start Aspirin  81mg  and plavix 75mg  daily and obtain full stroke workup. - Further workup pending above.  ______________________________________________________________________    Signed, Shizuye Rupert, MD [facility]

## 2023-08-25 NOTE — ED Notes (Signed)
 Attempted to call report to [facility]W. Was advised to call back to give report.

## 2023-08-25 NOTE — ED Provider Notes (Signed)
 [place] [facility] AT [facility] Provider Note   CSN: [id number] Arrival date & time: 08/25/23  1142     History  Chief Complaint  Patient presents with   Weakness    Jill Foley is a 58 y.o. female.  Pt is a 58 yo female with pmhx significant for HTN, DM2, CKD, diabetic retinopathy, HLD, achalasia, and TIA.  Pt noticed that she felt diffusely weak on 5/22.  She also noticed that a segment of her left visual field was cloudy.  She can't see out of her right eye chronically.  Pt thought it would get better and it has, but it has not gone away.         Home Medications Prior to Admission medications   Medication Sig Start Date End Date Taking? Authorizing Provider  aspirin  81 MG chewable tablet Chew 81 mg by mouth daily.    [provider]  BIOTIN PO Take 10,000 Units by mouth daily.     [provider]  Calcium Carb-Cholecalciferol 600-800 MG-UNIT TABS Take 1 tablet by mouth daily.    [provider]  cholecalciferol (VITAMIN D3) 25 MCG (1000 UT) tablet Take 2,000 Units by mouth daily.    [provider]  COVID-19 mRNA Vac-TriS, Pfizer, SUSP injection Inject into the muscle. 09/27/20   Liane Redman, MD  ferrous sulfate 325 (65 FE) MG tablet Take by mouth.    [provider]  ketotifen (ALAWAY) 0.025 % ophthalmic solution Apply to eye. 05/04/15   [provider]  lisinopril-hydrochlorothiazide (PRINZIDE,ZESTORETIC) 20-12.5 MG per tablet Take 1 tablet by mouth daily.    [provider]  Magnesium Oxide 250 MG TABS Take 1 tablet by mouth daily.    [provider]  methylcellulose (ARTIFICIAL TEARS) 1 % ophthalmic solution Place 1 drop into both eyes 2 (two) times daily.    [provider]  Multiple Vitamin (MULTIVITAMIN) capsule Take 1 capsule by mouth daily.    [provider]  ondansetron  (ZOFRAN  ODT) 4 MG disintegrating tablet Take 1 tablet (4 mg total) by  mouth every 8 (eight) hours as needed for nausea or vomiting. 12/01/20   Coretta Dexter, PA  pravastatin (PRAVACHOL) 40 MG tablet Take 40 mg by mouth daily.    [provider]  sitaGLIPtin (JANUVIA) 50 MG tablet Take 50 mg by mouth daily.    [provider]      Allergies    Fish allergy, Ivp dye [iodinated contrast media], and Xalatan [latanoprost]    Review of Systems   Review of Systems  Eyes:  Positive for visual disturbance.  Neurological:  Positive for weakness.  All other systems reviewed and are negative.   Physical Exam Updated Vital Signs BP 136/68   Pulse 100   Temp 98.9 F (37.2 C) (Oral)   Resp 11   Ht 5\' 10"  (1.778 m)   Wt 98.4 kg   LMP 09/07/2010   SpO2 100%   BMI 31.14 kg/m  Physical Exam Vitals and nursing note reviewed.  Constitutional:      Appearance: Normal appearance.  HENT:     Head: Normocephalic and atraumatic.     Right Ear: External ear normal.     Left Ear: External ear normal.     Nose: Nose normal.     Mouth/Throat:     Mouth: Mucous membranes are moist.     Pharynx: Oropharynx is clear.  Eyes:     Extraocular Movements: Extraocular movements intact.  Conjunctiva/sclera: Conjunctivae normal.     Pupils: Pupils are equal, round, and reactive to light.     Comments: Left eye visual field "cloudy" on the lateral visual field  Cardiovascular:     Rate and Rhythm: Normal rate and regular rhythm.     Pulses: Normal pulses.     Heart sounds: Normal heart sounds.  Pulmonary:     Effort: Pulmonary effort is normal.     Breath sounds: Normal breath sounds.  Abdominal:     General: Abdomen is flat. Bowel sounds are normal.     Palpations: Abdomen is soft.  Musculoskeletal:        General: Normal range of motion.     Cervical back: Normal range of motion and neck supple.  Skin:    General: Skin is warm.     Capillary Refill: Capillary refill takes less than 2 seconds.  Neurological:     General: No focal deficit  present.     Mental Status: She is alert and oriented to person, place, and time.  Psychiatric:        Mood and Affect: Mood normal.        Behavior: Behavior normal.     ED Results / Procedures / Treatments   Labs (all labs ordered are listed, but only abnormal results are displayed) Labs Reviewed  CBC - Abnormal; Notable for the following components:      Result Value   RBC 5.45 (*)    MCH 25.9 (*)    All other components within normal limits  COMPREHENSIVE METABOLIC PANEL WITH GFR - Abnormal; Notable for the following components:   Glucose, Bld 124 (*)    Creatinine, Ser 1.08 (*)    GFR, Estimated 59 (*)    All other components within normal limits  ETHANOL  PROTIME-INR  APTT  DIFFERENTIAL    EKG EKG Interpretation Date/Time:  Saturday Aug 25 2023 12:12:29 EDT Ventricular Rate:  85 PR Interval:  170 QRS Duration:  93 QT Interval:  371 QTC Calculation: 442 R Axis:   7  Text Interpretation: Sinus rhythm Consider left atrial enlargement Low voltage, precordial leads Consider anterior infarct No significant change since last tracing Confirmed by Sueellen Emery [phone]) on 08/25/2023 12:13:37 PM  Radiology CT HEAD WO CONTRAST Result Date: 08/25/2023 CLINICAL DATA:  Neuro deficit, acute, stroke suspected EXAM: CT HEAD WITHOUT CONTRAST TECHNIQUE: Contiguous axial images were obtained from the base of the skull through the vertex without intravenous contrast. RADIATION DOSE REDUCTION: This exam was performed according to the departmental dose-optimization program which includes automated exposure control, adjustment of the mA and/or kV according to patient size and/or use of iterative reconstruction technique. COMPARISON:  Head CT 03/24/2018 FINDINGS: Brain: No intracranial hemorrhage, mass effect, or midline shift. No hydrocephalus. The basilar cisterns are patent. No evidence of territorial infarct or acute ischemia. No extra-axial or intracranial fluid collection. Vascular:  Atherosclerosis of skullbase vasculature without hyperdense vessel or abnormal calcification. Skull: No fracture or focal lesion. Sinuses/Orbits: No acute finding. Chronic hyperdensity in the right globe. Other: None. IMPRESSION: No acute intracranial abnormality. Electronically Signed   By: Chadwick Colonel M.D.   On: 08/25/2023 14:22    Procedures Procedures    Medications Ordered in ED Medications  aspirin  tablet 325 mg (has no administration in time range)  clopidogrel (PLAVIX) tablet 300 mg (has no administration in time range)  sodium chloride  0.9 % bolus 1,000 mL (0 mLs Intravenous Stopped 08/25/23 1251)    ED Course/ Medical  Decision Making/ A&P                                 Medical Decision Making Amount and/or Complexity of Data Reviewed Labs: ordered. Radiology: ordered.  Risk OTC drugs. Prescription drug management. Decision regarding hospitalization.   This patient presents to the ED for concern of visual disturbance, this involves an extensive number of treatment options, and is a complaint that carries with it a high risk of complications and morbidity.  The differential diagnosis includes retinopathy, cva, electrolyte abn   Co morbidities that complicate the patient evaluation   HTN, DM2, CKD, diabetic retinopathy, HLD, achalasia, and TIA   Additional history obtained:  Additional history obtained from epic chart review External records from outside source obtained and reviewed including husband   Lab Tests:  I Ordered, and personally interpreted labs.  The pertinent results include:  cbc nl, cmp nl other than cr sl elevated at 1.08, inr 0.9, etoh neg   Imaging Studies ordered:  I ordered imaging studies including ct head  I independently visualized and interpreted imaging which showed No acute intracranial abnormality.  I agree with the radiologist interpretation   Cardiac Monitoring:  The patient was maintained on a cardiac monitor.  I  personally viewed and interpreted the cardiac monitored which showed an underlying rhythm of: nsr   Medicines ordered and prescription drug management:  I ordered medication including asa and plavix  for sx  Reevaluation of the patient after these medicines showed that the patient improved I have reviewed the patients home medicines and have made adjustments as needed   Test Considered:  Ct/mri   Consultations Obtained:  I requested consultation with the neurologist (Dr. Doretta Gant),  and discussed lab and imaging findings as well as pertinent plan -she recommends admission for stroke work up. Pt d/w Dr. Melvin ([facility]) for admission.   Problem List / ED Course:  Visual field disturbance:  possible occlusion of one of the branches of the retinal artery.  Neuro recommends stroke work up.   Reevaluation:  After the interventions noted above, I reevaluated the patient and found that they have :improved   Social Determinants of Health:  Lives at home   Dispostion:  After consideration of the diagnostic results and the patients response to treatment, I feel that the patent would benefit from admission.          Final Clinical Impression(s) / ED Diagnoses Final diagnoses:  Visual disturbances    Rx / DC Orders ED Discharge Orders     None         Sueellen Emery, MD 08/25/23 1455

## 2023-08-25 NOTE — Progress Notes (Signed)
 Pt admitted from [facility] with stroke like symptoms, alert and oriented, denies any pain at this time, admit Doc and Neurology paged and notified of pt's arrival, tele monitor put and verified on pt, safety concern explained and initiated, was however reassured and will continue to monitor. Obasogie-Asidi, Mayah Urquidi Efe

## 2023-08-26 ENCOUNTER — Other Ambulatory Visit (HOSPITAL_COMMUNITY)

## 2023-08-26 ENCOUNTER — Observation Stay (HOSPITAL_COMMUNITY)

## 2023-08-26 DIAGNOSIS — E785 Hyperlipidemia, unspecified: Secondary | ICD-10-CM

## 2023-08-26 DIAGNOSIS — Z8673 Personal history of transient ischemic attack (TIA), and cerebral infarction without residual deficits: Secondary | ICD-10-CM

## 2023-08-26 DIAGNOSIS — I10 Essential (primary) hypertension: Secondary | ICD-10-CM | POA: Diagnosis not present

## 2023-08-26 DIAGNOSIS — Z7984 Long term (current) use of oral hypoglycemic drugs: Secondary | ICD-10-CM

## 2023-08-26 DIAGNOSIS — Z6831 Body mass index (BMI) 31.0-31.9, adult: Secondary | ICD-10-CM

## 2023-08-26 DIAGNOSIS — E669 Obesity, unspecified: Secondary | ICD-10-CM

## 2023-08-26 DIAGNOSIS — H5462 Unqualified visual loss, left eye, normal vision right eye: Secondary | ICD-10-CM

## 2023-08-26 DIAGNOSIS — E113553 Type 2 diabetes mellitus with stable proliferative diabetic retinopathy, bilateral: Secondary | ICD-10-CM

## 2023-08-26 DIAGNOSIS — E11319 Type 2 diabetes mellitus with unspecified diabetic retinopathy without macular edema: Secondary | ICD-10-CM | POA: Diagnosis not present

## 2023-08-26 DIAGNOSIS — E119 Type 2 diabetes mellitus without complications: Secondary | ICD-10-CM | POA: Diagnosis not present

## 2023-08-26 LAB — COMPREHENSIVE METABOLIC PANEL WITH GFR
ALT: 26 U/L (ref 0–44)
AST: 27 U/L (ref 15–41)
Albumin: 3.6 g/dL (ref 3.5–5.0)
Alkaline Phosphatase: 68 U/L (ref 38–126)
Anion gap: 10 (ref 5–15)
BUN: 10 mg/dL (ref 6–20)
CO2: 27 mmol/L (ref 22–32)
Calcium: 9.7 mg/dL (ref 8.9–10.3)
Chloride: 105 mmol/L (ref 98–111)
Creatinine, Ser: 1.05 mg/dL — ABNORMAL HIGH (ref 0.44–1.00)
GFR, Estimated: >60 mL/min (ref >60)
Glucose, Bld: 100 mg/dL — ABNORMAL HIGH (ref 70–99)
Potassium: 4.6 mmol/L (ref 3.5–5.1)
Sodium: 142 mmol/L (ref 135–145)
Total Bilirubin: 0.7 mg/dL (ref 0.0–1.2)
Total Protein: 6.9 g/dL (ref 6.5–8.1)

## 2023-08-26 LAB — CBC
HCT: 44.8 % (ref 36.0–46.0)
Hemoglobin: 14.1 g/dL (ref 12.0–15.0)
MCH: 25.9 pg — ABNORMAL LOW (ref 26.0–34.0)
MCHC: 31.5 g/dL (ref 30.0–36.0)
MCV: 82.4 fL (ref 80.0–100.0)
Platelets: 299 10*3/uL (ref 150–400)
RBC: 5.44 MIL/uL — ABNORMAL HIGH (ref 3.87–5.11)
RDW: 14 % (ref 11.5–15.5)
WBC: 4 10*3/uL (ref 4.0–10.5)
nRBC: 0 % (ref 0.0–0.2)

## 2023-08-26 LAB — LIPID PANEL
Cholesterol: 150 mg/dL (ref 0–200)
HDL: 61 mg/dL (ref >40)
LDL Cholesterol: 74 mg/dL (ref 0–99)
Total CHOL/HDL Ratio: 2.5 ratio
Triglycerides: 74 mg/dL (ref <150)
VLDL: 15 mg/dL (ref 0–40)

## 2023-08-26 LAB — HEMOGLOBIN A1C
Hgb A1c MFr Bld: 7.5 % — ABNORMAL HIGH (ref 4.8–5.6)
Mean Plasma Glucose: 168.55 mg/dL

## 2023-08-26 LAB — HIV ANTIBODY (ROUTINE TESTING W REFLEX): HIV Screen 4th Generation wRfx: NONREACTIVE

## 2023-08-26 LAB — GLUCOSE, CAPILLARY
Glucose-Capillary: 106 mg/dL — ABNORMAL HIGH (ref 70–99)
Glucose-Capillary: 103 mg/dL — ABNORMAL HIGH (ref 70–99)

## 2023-08-26 MED ORDER — ASPIRIN 81 MG PO CHEW
81.0000 mg | CHEWABLE_TABLET | Freq: Every day | ORAL | Status: DC
  Administered 2023-08-26: 81 mg via ORAL
  Filled 2023-08-26: qty 1

## 2023-08-26 NOTE — Hospital Course (Signed)
 Jill Foley is a 58 y.o. female with PMH bilateral diabetic retinopathy (follows with [facility] ophthalmology outpatient), DM II who presented with worsening vision in her left eye.  At baseline she has no vision from her right eye.  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  On admission due to vision changes, she underwent stroke workup and ophthalmology evaluation. MRI brain was negative for stroke or acute findings. She was evaluated by ophthalmology and vision had returned to baseline in the left eye.  There were no acute findings on dilated fundus exam.  She is noted to have extensive ischemia of vessels in the left eye consistent with diabetic retinopathy. No further workup was recommended and she was felt to be back to normal baseline. She will also evaluated by neurology prior to discharge.  As she was at normal neurologic baseline, she was felt to be stable for discharge home with ongoing management and follow-up with her outpatient ophthalmology.

## 2023-08-26 NOTE — Consult Note (Signed)
 Reason for consult: vision change OS 3 days ago.  Ophthalmology paged at 2:30 am for inpatient consult  HPI: Jill Foley is an 58 y.o. female.  She has extensive history of severe diabetic retinopathy requiring bilateral vitrectomies in the past.  She has been blind in the right eye for years.  Her current eye doctors are Dr. Jackquelyn Mass and Dr. Rosalba Collier (retina specialist in [facility]) and Dr Lambert Pillion (corneal specialist at [facility].) On Thursday, she felt like her left eye "felt tight on the left outside corner:.  She felt like the vision got "a little cloudier than normal" temporally OS.  No trauma. She reports that SHE LOST HER PERIPHERAL VISION os YEARS AGO when she had vitrectomy with laser for dibetic retinopathy. She currently feels that her limited peripheral vision is back to baseline. The left eye still "still feels a little tight temporally but is not painful.  She went to ER at [facility] med center yesterday.  Transported by ambulance to [facility].  She has seen a neurologist.  Ophthalmology paged overnight at 2:20 am today.  She has had a CT brain and MRI brain for this.  Past Ocular History: Bilateral vitrectomy for diabetic retinopathy, bilateral cataract surgery.  Denies glaucoma  Family ocular history:  glaucoma  Past Medical History:  Diagnosis Date   Achalasia    Anemia    Diabetes mellitus    Hypercholesteremia    Hypertension    Legally blind    Seasonal allergies    Sinus infection    Stroke [facility]) 2013   TIA   Past Surgical History:  Procedure Laterality Date   BREAST BIOPSY Left 09/10/2015   Procedure: EXCISION CYST LEFT BREAST;  Surgeon: Lillette Reid III, MD;  Location: [facility];  Service: General;  Laterality: Left;  EXCISION CYST LEFT BREAST   BREAST BIOPSY Right 2008   benign   BREAST EXCISIONAL BIOPSY Left    benign   BREAST SURGERY Bilateral    Biopsy,LFT 2000,Right 2008   EYE SURGERY  2000   cataract removal with lens  implants, both eyes   SKIN BIOPSY     VITRECTOMY  lft  1999, Right2002   lft  1999, Right2002   Family History  Problem Relation Age of Onset   Hypertension Mother    Diabetes Mother    Heart Problems Mother    Stroke Mother    Hypertension Father    Glaucoma Father    Diabetes Other    Hypertension Other    Hyperlipidemia Other    Breast cancer Paternal Aunt    Sudden death Neg Hx    Heart attack Neg Hx    Current Facility-Administered Medications  Medication Dose Route Frequency Provider Last Rate Last Admin    stroke: early stages of recovery book   Does not apply Once Sundil, Subrina, MD       acetaminophen  (TYLENOL ) tablet 650 mg  650 mg Oral Q4H PRN Sundil, Subrina, MD       Or   acetaminophen  (TYLENOL ) 160 MG/5ML solution 650 mg  650 mg Per Tube Q4H PRN Sundil, Subrina, MD       Or   acetaminophen  (TYLENOL ) suppository 650 mg  650 mg Rectal Q4H PRN Sundil, Subrina, MD       aspirin  chewable tablet 81 mg  81 mg Oral Daily Sundil, Subrina, MD       enoxaparin  (LOVENOX ) injection 40 mg  40 mg Subcutaneous Q24H Sundil, Subrina,  MD       insulin aspart (novoLOG) injection 0-5 Units  0-5 Units Subcutaneous QHS Sundil, Subrina, MD       insulin aspart (novoLOG) injection 0-6 Units  0-6 Units Subcutaneous TID WC Sundil, Subrina, MD       pravastatin (PRAVACHOL) tablet 40 mg  40 mg Oral Daily Sundil, Subrina, MD       sodium chloride  flush (NS) 0.9 % injection 3-10 mL  3-10 mL Intravenous Q12H Sundil, Subrina, MD   10 mL at 08/25/23 2155   sodium chloride  flush (NS) 0.9 % injection 3-10 mL  3-10 mL Intravenous PRN Sundil, Subrina, MD       Allergies  Allergen Reactions   Fish Allergy Itching   Ivp Dye [Iodinated Contrast Media] Itching   Xalatan [Latanoprost]    Social History   Socioeconomic History   Marital status: Single    Spouse name: Not on file   Number of children: 0   Years of education: BS   Highest education level: Not on file  Occupational History    Occupation: retired  Tobacco Use   Smoking status: Never   Smokeless tobacco: Never  Substance and Sexual Activity   Alcohol use: No    Alcohol/week: 0.0 standard drinks of alcohol   Drug use: No   Sexual activity: Not Currently    Birth control/protection: Post-menopausal  Other Topics Concern   Not on file  Social History Narrative   Patient lives at home alone.   Caffeine Use: occasionally   Social Drivers of Corporate investment banker Strain: Not on file  Food Insecurity: Low Risk  (08/03/2023)   Received from [facility]   Hunger Vital Sign    Worried About Running Out of Food in the Last Year: Never true    Ran Out of Food in the Last Year: Never true  Transportation Needs: No Transportation Needs (08/03/2023)   Received from [facility]    In the past 12 months, has lack of reliable transportation kept you from medical appointments, meetings, work or from getting things needed for daily living? : No  Physical Activity: Sufficiently Active (04/09/2018)   Received from [facility] visits prior to 06/03/2022., [facility] visits prior to 06/03/2022.   Exercise Vital Sign    Days of Exercise per Week: 4 days    Minutes of Exercise per Session: 60 min  Stress: Not on file  Social Connections: Not on file  Intimate Partner Violence: Not on file    Review of systems: Review of Systems  Constitutional:  Negative for chills and fever.  HENT:  Positive for sinus pain.   Eyes:  Positive for blurred vision and double vision.  Respiratory:  Negative for shortness of breath.   Cardiovascular:  Negative for chest pain.  Gastrointestinal:  Negative for nausea and vomiting.  Genitourinary:  Positive for dysuria.  Musculoskeletal:  Negative for myalgias.  Neurological:  Positive for weakness. Negative for dizziness, tingling, tremors, speech change and headaches.  Endo/Heme/Allergies:  Bruises/bleeds easily.   Psychiatric/Behavioral: Negative.      Physical Exam:  Blood pressure (!) 150/77, pulse 93, temperature 97.8 F (36.6 C), temperature source Oral, resp. rate 18, height 5\' 10"  (1.778 m), weight 98.4 kg, last menstrual period 09/07/2010, SpO2 99%.   VA cc (OTC rdrs):  OD: No light perception  OS: 20/70 near with readers.  (She normally wears a contact lens OS but does not  have it here  Pupils:   OD: fixed and off center, no reaction to light          OS: surgically distorted  IOP (T pen)  OD:17   OS:21    CVF: OD: No vision   OS full to CF  Motility:  OD full ductions  OS full ductions  Balance/alignment:  slight RXT   Bedside examination:  (no slit lamp available                                 OD                                       External/adnexa: Normal                                      Lids/lashes:        Normal                                      Conjunctiva        White, quiet        Cornea:              Clear                  AC:                     Deep,                                Iris:                     Pupil scarred and off center        Lens:                  PCIOL                                     OS                                       External/adnexa: Normal                                      Lids/lashes:        Ptosis                                     Conjunctiva        White, quiet        Cornea:              Clear                  AC:  Deep,                                Iris:                     Surgical pupil.  Distorted by scarring and IOL        Lens:                  She appears to have a 3-piece IOL half way in anterior chamber  (On questioning, she reports that she was told that her lens implant has been in the wrong position since the 1990s)    Dilated fundus exam: (Neo 2.5; Myd 1%)      OD The pupil did not dilate well due to scarring.  All I can see is extensive laser scarring and fibrosis     OS:  also  poor dilation Vitreous            s/p vitrectomy                                Optic Disc:       extremely pale                      Macula:             MAs and focal laser consistent with diabetic retinopathy                                          Vessels:           Extensive ischemia of vessles        Periphery:         Extensive laser scarring        Labs/studies: Results for orders placed or performed during the hospital encounter of 08/25/23 (from the past 48 hours)  Ethanol     Status: None   Collection Time: 08/25/23 12:19 PM  Result Value Ref Range   Alcohol, Ethyl (B) <15 <15 mg/dL    Comment: (NOTE) For medical purposes only. Performed at [facility], [address] [place], [place] [address]   Protime-INR     Status: None   Collection Time: 08/25/23 12:19 PM  Result Value Ref Range   Prothrombin Time 12.7 11.4 - 15.2 seconds   INR 0.9 0.8 - 1.2    Comment: (NOTE) INR goal varies based on device and disease states. Performed at [facility], [address] [place], [place] [address]   APTT     Status: None   Collection Time: 08/25/23 12:19 PM  Result Value Ref Range   aPTT 28 24 - 36 seconds    Comment: Performed at [facility], [address] [place], [place] [address]  CBC     Status: Abnormal   Collection Time: 08/25/23 12:19 PM  Result Value Ref Range   WBC 4.8 4.0 - 10.5 K/uL   RBC 5.45 (H) 3.87 - 5.11 MIL/uL   Hemoglobin 14.1 12.0 - 15.0 g/dL   HCT 91.4 78.2 - 95.6 %   MCV 81.8 80.0 - 100.0 fL   MCH 25.9 (L) 26.0 - 34.0 pg   MCHC 31.6  30.0 - 36.0 g/dL   RDW 21.3 08.6 - 57.8 %   Platelets 300 150 - 400 K/uL   nRBC 0.0 0.0 - 0.2 %    Comment: Performed at [facility], [address] [place], [place] [address]  Differential     Status: None   Collection Time: 08/25/23 12:19 PM  Result Value Ref Range   Neutrophils Relative % 57 %   Neutro Abs 2.7 1.7 - 7.7 K/uL   Lymphocytes Relative 25 %   Lymphs  Abs 1.2 0.7 - 4.0 K/uL   Monocytes Relative 13 %   Monocytes Absolute 0.6 0.1 - 1.0 K/uL   Eosinophils Relative 4 %   Eosinophils Absolute 0.2 0.0 - 0.5 K/uL   Basophils Relative 1 %   Basophils Absolute 0.0 0.0 - 0.1 K/uL   Immature Granulocytes 0 %   Abs Immature Granulocytes 0.01 0.00 - 0.07 K/uL    Comment: Performed at [facility], [address] [place], [place] [address]  Comprehensive metabolic panel     Status: Abnormal   Collection Time: 08/25/23 12:19 PM  Result Value Ref Range   Sodium 141 135 - 145 mmol/L   Potassium 4.3 3.5 - 5.1 mmol/L   Chloride 100 98 - 111 mmol/L   CO2 29 22 - 32 mmol/L   Glucose, Bld 124 (H) 70 - 99 mg/dL    Comment: Glucose reference range applies only to samples taken after fasting for at least 8 hours.   BUN 18 6 - 20 mg/dL   Creatinine, Ser 1.32 (H) 0.44 - 1.00 mg/dL   Calcium 44.0 8.9 - 10.2 mg/dL   Total Protein 7.6 6.5 - 8.1 g/dL   Albumin 4.3 3.5 - 5.0 g/dL   AST 35 15 - 41 U/L   ALT 31 0 - 44 U/L   Alkaline Phosphatase 96 38 - 126 U/L   Total Bilirubin 0.3 0.0 - 1.2 mg/dL   GFR, Estimated 59 (L) >60 mL/min    Comment: (NOTE) Calculated using the CKD-EPI Creatinine Equation (2021)    Anion gap 12 5 - 15    Comment: Performed at [facility], [address] [place], [place] [address]  Glucose, capillary     Status: Abnormal   Collection Time: 08/25/23  9:28 PM  Result Value Ref Range   Glucose-Capillary 145 (H) 70 - 99 mg/dL    Comment: Glucose reference range applies only to samples taken after fasting for at least 8 hours.  HIV Antibody (routine testing w rflx)     Status: None   Collection Time: 08/25/23 11:24 PM  Result Value Ref Range   HIV Screen 4th Generation wRfx Non Reactive Non Reactive    Comment: Performed at [facility], [address] [place], [place] [address]  Glucose, capillary     Status: Abnormal   Collection Time: 08/26/23  6:17 AM  Result Value Ref Range   Glucose-Capillary  106 (H) 70 - 99 mg/dL    Comment: Glucose reference range applies only to samples taken after fasting for at least 8 hours.  Lipid panel     Status: None   Collection Time: 08/26/23  6:48 AM  Result Value Ref Range   Cholesterol 150 0 - 200 mg/dL   Triglycerides 74 <474 mg/dL   HDL 61 >25 mg/dL   Total CHOL/HDL Ratio 2.5 RATIO   VLDL 15 0 - 40 mg/dL   LDL Cholesterol 74  0 - 99 mg/dL    Comment:        Total Cholesterol/HDL:CHD Risk Coronary Heart Disease Risk Table                     Men   Women  1/2 Average Risk   3.4   3.3  Average Risk       5.0   4.4  2 X Average Risk   9.6   7.1  3 X Average Risk  23.4   11.0        Use the calculated Patient Ratio above and the CHD Risk Table to determine the patient's CHD Risk.        ATP III CLASSIFICATION (LDL):  <100     mg/dL   Optimal  191-478  mg/dL   Near or Above                    Optimal  130-159  mg/dL   Borderline  295-621  mg/dL   High  >308     mg/dL   Very High Performed at [facility], [address], [place], [place] [address]   Hemoglobin A1c     Status: Abnormal   Collection Time: 08/26/23  6:48 AM  Result Value Ref Range   Hgb A1c MFr Bld 7.5 (H) 4.8 - 5.6 %    Comment: (NOTE) Pre diabetes:          5.7%-6.4%  Diabetes:              >6.4%  Glycemic control for   <7.0% adults with diabetes    Mean Plasma Glucose 168.55 mg/dL    Comment: Performed at [facility], [address], [place], [place] [address]  CBC     Status: Abnormal   Collection Time: 08/26/23  6:48 AM  Result Value Ref Range   WBC 4.0 4.0 - 10.5 K/uL   RBC 5.44 (H) 3.87 - 5.11 MIL/uL   Hemoglobin 14.1 12.0 - 15.0 g/dL   HCT 52.8 41.3 - 24.4 %   MCV 82.4 80.0 - 100.0 fL   MCH 25.9 (L) 26.0 - 34.0 pg   MCHC 31.5 30.0 - 36.0 g/dL   RDW 01.0 27.2 - 53.6 %   Platelets 299 150 - 400 K/uL   nRBC 0.0 0.0 - 0.2 %    Comment: Performed at [facility], [address], [place], [place] [address]  Comprehensive metabolic  panel     Status: Abnormal   Collection Time: 08/26/23  6:48 AM  Result Value Ref Range   Sodium 142 135 - 145 mmol/L   Potassium 4.6 3.5 - 5.1 mmol/L   Chloride 105 98 - 111 mmol/L   CO2 27 22 - 32 mmol/L   Glucose, Bld 100 (H) 70 - 99 mg/dL    Comment: Glucose reference range applies only to samples taken after fasting for at least 8 hours.   BUN 10 6 - 20 mg/dL   Creatinine, Ser 4.74 (H) 0.44 - 1.00 mg/dL   Calcium 9.7 8.9 - 25.9 mg/dL   Total Protein 6.9 6.5 - 8.1 g/dL   Albumin 3.6 3.5 - 5.0 g/dL   AST 27 15 - 41 U/L   ALT 26 0 - 44 U/L   Alkaline Phosphatase 68 38 - 126 U/L   Total Bilirubin 0.7 0.0 - 1.2 mg/dL   GFR, Estimated >56 >38 mL/min    Comment: (NOTE)  Calculated using the CKD-EPI Creatinine Equation (2021)    Anion gap 10 5 - 15    Comment: Performed at [facility], [address], [place], [place] [address]   MR BRAIN WO CONTRAST Result Date: 08/26/2023 CLINICAL DATA:  Rule out a stroke EXAM: MRI HEAD WITHOUT CONTRAST TECHNIQUE: Multiplanar, multiecho pulse sequences of the brain and surrounding structures were obtained without intravenous contrast. COMPARISON:  CT head May 24, 25. MRI head 11/04/2011 FINDINGS: Brain: No acute infarction, hemorrhage, hydrocephalus, extra-axial collection or mass lesion. Mild scattered T2/FLAIR hyperintensities in the white matter, which are nonspecific but compatible with chronic microvascular ischemic disease. Vascular: Normal flow voids. Skull and upper cervical spine: Normal marrow signal. Sinuses/Orbits: Clear sinuses.  No acute orbital findings. Other: No mastoid effusions. IMPRESSION: No evidence of acute intracranial abnormality. Electronically Signed   By: Stevenson Elbe M.D.   On: 08/26/2023 03:51   CT HEAD WO CONTRAST Result Date: 08/25/2023 CLINICAL DATA:  Neuro deficit, acute, stroke suspected EXAM: CT HEAD WITHOUT CONTRAST TECHNIQUE: Contiguous axial images were obtained from the base of the skull through the  vertex without intravenous contrast. RADIATION DOSE REDUCTION: This exam was performed according to the departmental dose-optimization program which includes automated exposure control, adjustment of the mA and/or kV according to patient size and/or use of iterative reconstruction technique. COMPARISON:  Head CT 03/24/2018 FINDINGS: Brain: No intracranial hemorrhage, mass effect, or midline shift. No hydrocephalus. The basilar cisterns are patent. No evidence of territorial infarct or acute ischemia. No extra-axial or intracranial fluid collection. Vascular: Atherosclerosis of skullbase vasculature without hyperdense vessel or abnormal calcification. Skull: No fracture or focal lesion. Sinuses/Orbits: No acute finding. Chronic hyperdensity in the right globe. Other: None. IMPRESSION: No acute intracranial abnormality. Electronically Signed   By: Chadwick Colonel M.D.   On: 08/25/2023 14:22                             Assessment and Plan: Extensive proliferative diabetic retinopathy.  No obvious acute findings, but I do not know her baseline.  PATIENT REPORTS THAT HER VISION IS BACK TO BASELINE.  Recommend:  Follow-up with her ophthalmologist as planned in a few weeks. Left eye feels "a little tight" .  Not painful. No worrisome findings.  This should get better.   All of the above information was relayed to the patient and/or patient family.  All questions were answered.   Reyana Leisey L 08/26/2023, 9:08 AM  [facility] [phone]

## 2023-08-26 NOTE — Plan of Care (Signed)
 Problem: Education: Goal: Knowledge of General Education information will improve Description: Including pain rating scale, medication(s)/side effects and non-pharmacologic comfort measures Outcome: Adequate for Discharge   Problem: Health Behavior/Discharge Planning: Goal: Ability to manage health-related needs will improve Outcome: Adequate for Discharge   Problem: Clinical Measurements: Goal: Ability to maintain clinical measurements within normal limits will improve Outcome: Adequate for Discharge Goal: Will remain free from infection Outcome: Adequate for Discharge Goal: Diagnostic test results will improve Outcome: Adequate for Discharge Goal: Respiratory complications will improve Outcome: Adequate for Discharge Goal: Cardiovascular complication will be avoided Outcome: Adequate for Discharge   Problem: Activity: Goal: Risk for activity intolerance will decrease Outcome: Adequate for Discharge   Problem: Nutrition: Goal: Adequate nutrition will be maintained Outcome: Adequate for Discharge   Problem: Coping: Goal: Level of anxiety will decrease Outcome: Adequate for Discharge   Problem: Elimination: Goal: Will not experience complications related to bowel motility Outcome: Adequate for Discharge Goal: Will not experience complications related to urinary retention Outcome: Adequate for Discharge   Problem: Pain Managment: Goal: General experience of comfort will improve and/or be controlled Outcome: Adequate for Discharge   Problem: Safety: Goal: Ability to remain free from injury will improve Outcome: Adequate for Discharge   Problem: Skin Integrity: Goal: Risk for impaired skin integrity will decrease Outcome: Adequate for Discharge   Problem: Education: Goal: Ability to describe self-care measures that may prevent or decrease complications (Diabetes Survival Skills Education) will improve Outcome: Adequate for Discharge Goal: Individualized Educational  Video(s) Outcome: Adequate for Discharge   Problem: Coping: Goal: Ability to adjust to condition or change in health will improve Outcome: Adequate for Discharge   Problem: Fluid Volume: Goal: Ability to maintain a balanced intake and output will improve Outcome: Adequate for Discharge   Problem: Health Behavior/Discharge Planning: Goal: Ability to identify and utilize available resources and services will improve Outcome: Adequate for Discharge Goal: Ability to manage health-related needs will improve Outcome: Adequate for Discharge   Problem: Metabolic: Goal: Ability to maintain appropriate glucose levels will improve Outcome: Adequate for Discharge   Problem: Nutritional: Goal: Maintenance of adequate nutrition will improve Outcome: Adequate for Discharge Goal: Progress toward achieving an optimal weight will improve Outcome: Adequate for Discharge   Problem: Skin Integrity: Goal: Risk for impaired skin integrity will decrease Outcome: Adequate for Discharge   Problem: Tissue Perfusion: Goal: Adequacy of tissue perfusion will improve Outcome: Adequate for Discharge   Problem: Education: Goal: Knowledge of disease or condition will improve Outcome: Adequate for Discharge Goal: Knowledge of secondary prevention will improve (MUST DOCUMENT ALL) Outcome: Adequate for Discharge Goal: Knowledge of patient specific risk factors will improve (DELETE if not current risk factor) Outcome: Adequate for Discharge   Problem: Ischemic Stroke/TIA Tissue Perfusion: Goal: Complications of ischemic stroke/TIA will be minimized Outcome: Adequate for Discharge   Problem: Coping: Goal: Will verbalize positive feelings about self Outcome: Adequate for Discharge Goal: Will identify appropriate support needs Outcome: Adequate for Discharge   Problem: Health Behavior/Discharge Planning: Goal: Ability to manage health-related needs will improve Outcome: Adequate for Discharge Goal:  Goals will be collaboratively established with patient/family Outcome: Adequate for Discharge   Problem: Self-Care: Goal: Ability to participate in self-care as condition permits will improve Outcome: Adequate for Discharge Goal: Verbalization of feelings and concerns over difficulty with self-care will improve Outcome: Adequate for Discharge Goal: Ability to communicate needs accurately will improve Outcome: Adequate for Discharge   Problem: Nutrition: Goal: Risk of aspiration will decrease Outcome: Adequate for Discharge  Goal: Dietary intake will improve Outcome: Adequate for Discharge

## 2023-08-26 NOTE — Care Management Obs Status (Signed)
 MEDICARE OBSERVATION STATUS NOTIFICATION   Patient Details  Name: Jill Foley MRN: [id number] Date of Birth: [birth year]   Medicare Observation Status Notification Given:  Yes    Arnisha Laffoon G., RN 08/26/2023, 8:55 AM

## 2023-08-26 NOTE — Discharge Summary (Signed)
 Physician Discharge Summary   Jill Foley ZOX:[id number] DOB: May 04, 1965 DOA: 08/25/2023  PCP: Podraza, Cole Christopher, PA-C  Admit date: 08/25/2023 Discharge date: 08/26/2023  Admitted From: Home Disposition:  Home Discharging physician: Faith Homes, MD Barriers to discharge: none  Recommendations at discharge: Continue management with ophthalmology outpatient  Discharge Condition: stable CODE STATUS: Full  Diet recommendation:  Diet Orders (From admission, onward)     Start     Ordered   08/26/23 0000  Diet - low sodium heart healthy        08/26/23 1210   08/26/23 0000  Diet Carb Modified        08/26/23 1210   08/25/23 2052  Diet heart healthy/carb modified Room service appropriate? Yes; Fluid consistency: Thin  Diet effective now       Question Answer Comment  Diet-HS Snack? Nothing   Room service appropriate? Yes   Fluid consistency: Thin      08/25/23 2051            Hospital Course: Jill Foley is a 58 y.o. female with PMH bilateral diabetic retinopathy (follows with [facility] ophthalmology outpatient), DM II who presented with worsening vision in her left eye.  At baseline she has no vision from her right eye.  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  On admission due to vision changes, she underwent stroke workup and ophthalmology evaluation. MRI brain was negative for stroke or acute findings. She was evaluated by ophthalmology and vision had returned to baseline in the left eye.  There were no acute findings on dilated fundus exam.  She is noted to have extensive ischemia of vessels in the left eye consistent with diabetic retinopathy. No further workup was recommended and she was felt to be back to normal baseline. She will also evaluated by neurology prior to discharge.  As she was at normal neurologic  baseline, she was felt to be stable for discharge home with ongoing management and follow-up with her outpatient ophthalmology.   The patient's acute and chronic medical conditions were treated accordingly. On day of discharge, patient was felt deemed stable for discharge. Patient/family member advised to call PCP or come back to ER if needed.   Principal Diagnosis: Decreased vision of left eye  Discharge Diagnoses: Active Hospital Problems   Diagnosis Date Noted   Diabetic retinopathy (HCC) 08/25/2023    Priority: 1.   Generalized weakness 08/25/2023   Non-insulin dependent type 2 diabetes mellitus (HCC) 08/25/2023   Hyperlipidemia 01/06/2020   Essential hypertension 01/05/2020   CKD (chronic kidney disease), stage III [facility]) 01/21/2019    Resolved Hospital Problems   Diagnosis Date Noted Date Resolved   Decreased vision of left eye 08/25/2023 08/26/2023    Priority: 1.     Discharge Instructions     Diet - low sodium heart healthy   Complete by: As directed    Diet Carb Modified   Complete by: As directed    Increase activity slowly   Complete by: As directed       Allergies as of 08/26/2023       Reactions   Fish Allergy Itching   Ivp Dye [iodinated Contrast Media] Itching   Xalatan [latanoprost]         Medication List     STOP taking these medications    Pfizer-BioNT COVID-19 Vac-TriS Susp injection Generic drug: COVID-19 mRNA Vac-TriS (  Pfizer)       TAKE these medications    Alaway 0.025 % ophthalmic solution Generic drug: ketotifen Apply to eye.   aspirin  81 MG chewable tablet Chew 81 mg by mouth daily.   BIOTIN PO Take 10,000 Units by mouth daily.   Calcium Carb-Cholecalciferol 600-800 MG-UNIT Tabs Take 1 tablet by mouth daily.   cholecalciferol 25 MCG (1000 UNIT) tablet Commonly known as: VITAMIN D3 Take 2,000 Units by mouth daily.   ferrous sulfate 325 (65 FE) MG tablet Take by mouth.   lisinopril-hydrochlorothiazide 20-12.5 MG  tablet Commonly known as: ZESTORETIC Take 1 tablet by mouth daily.   Magnesium Oxide 250 MG Tabs Take 1 tablet by mouth daily.   methylcellulose 1 % ophthalmic solution Commonly known as: ARTIFICIAL TEARS Place 1 drop into both eyes 2 (two) times daily.   multivitamin capsule Take 1 capsule by mouth daily.   ondansetron  4 MG disintegrating tablet Commonly known as: Zofran  ODT Take 1 tablet (4 mg total) by mouth every 8 (eight) hours as needed for nausea or vomiting.   pravastatin 40 MG tablet Commonly known as: PRAVACHOL Take 40 mg by mouth daily.   sitaGLIPtin 50 MG tablet Commonly known as: JANUVIA Take 50 mg by mouth daily.        Allergies  Allergen Reactions   Fish Allergy Itching   Ivp Dye [Iodinated Contrast Media] Itching   Xalatan [Latanoprost]     Consultations: Neurology Ophthalmology  Procedures:   Discharge Exam: BP (!) 150/73 (BP Location: Left Arm)   Pulse 85   Temp 97.8 F (36.6 C) (Oral)   Resp 18   Ht 5\' 10"  (1.778 m)   Wt 98.4 kg   LMP 09/07/2010   SpO2 100%   BMI 31.14 kg/m  Physical Exam Constitutional:      Appearance: Normal appearance.  HENT:     Head: Normocephalic and atraumatic.     Mouth/Throat:     Mouth: Mucous membranes are moist.  Eyes:     Comments: No vision in right eye; blurry but stable/baseline vision in left eye  Cardiovascular:     Rate and Rhythm: Normal rate and regular rhythm.  Pulmonary:     Effort: Pulmonary effort is normal. No respiratory distress.     Breath sounds: Normal breath sounds. No wheezing.  Abdominal:     General: Bowel sounds are normal. There is no distension.     Palpations: Abdomen is soft.     Tenderness: There is no abdominal tenderness.  Musculoskeletal:        General: Normal range of motion.     Cervical back: Normal range of motion and neck supple.  Skin:    General: Skin is warm and dry.  Neurological:     General: No focal deficit present.     Mental Status: She is  alert.  Psychiatric:        Mood and Affect: Mood normal.      The results of significant diagnostics from this hospitalization (including imaging, microbiology, ancillary and laboratory) are listed below for reference.   Microbiology: No results found for this or any previous visit (from the past 240 hours).   Labs: BNP (last 3 results) No results for input(s): "BNP" in the last 8760 hours. Basic Metabolic Panel: Recent Labs  Lab 08/25/23 1219 08/26/23 0648  NA 141 142  K 4.3 4.6  CL 100 105  CO2 29 27  GLUCOSE 124* 100*  BUN 18 10  CREATININE 1.08*  1.05*  CALCIUM 10.0 9.7   Liver Function Tests: Recent Labs  Lab 08/25/23 1219 08/26/23 0648  AST 35 27  ALT 31 26  ALKPHOS 96 68  BILITOT 0.3 0.7  PROT 7.6 6.9  ALBUMIN 4.3 3.6   No results for input(s): "LIPASE", "AMYLASE" in the last 168 hours. No results for input(s): "AMMONIA" in the last 168 hours. CBC: Recent Labs  Lab 08/25/23 1219 08/26/23 0648  WBC 4.8 4.0  NEUTROABS 2.7  --   HGB 14.1 14.1  HCT 44.6 44.8  MCV 81.8 82.4  PLT 300 299   Cardiac Enzymes: No results for input(s): "CKTOTAL", "CKMB", "CKMBINDEX", "TROPONINI" in the last 168 hours. BNP: Invalid input(s): "POCBNP" CBG: Recent Labs  Lab 08/25/23 2128 08/26/23 0617 08/26/23 1158  GLUCAP 145* 106* 103*   D-Dimer No results for input(s): "DDIMER" in the last 72 hours. Hgb A1c Recent Labs    08/26/23 0648  HGBA1C 7.5*   Lipid Profile Recent Labs    08/26/23 0648  CHOL 150  HDL 61  LDLCALC 74  TRIG 74  CHOLHDL 2.5   Thyroid function studies No results for input(s): "TSH", "T4TOTAL", "T3FREE", "THYROIDAB" in the last 72 hours.  Invalid input(s): "FREET3" Anemia work up No results for input(s): "VITAMINB12", "FOLATE", "FERRITIN", "TIBC", "IRON", "RETICCTPCT" in the last 72 hours. Urinalysis    Component Value Date/Time   COLORURINE STRAW (A) 11/13/2022 0113   APPEARANCEUR CLEAR 11/13/2022 0113   LABSPEC 1.010  11/13/2022 0113   PHURINE 5.5 11/13/2022 0113   GLUCOSEU NEGATIVE 11/13/2022 0113   HGBUR NEGATIVE 11/13/2022 0113   BILIRUBINUR NEGATIVE 11/13/2022 0113   KETONESUR NEGATIVE 11/13/2022 0113   PROTEINUR NEGATIVE 11/13/2022 0113   UROBILINOGEN 0.2 10/20/2011 1324   NITRITE NEGATIVE 11/13/2022 0113   LEUKOCYTESUR NEGATIVE 11/13/2022 0113   Sepsis Labs Recent Labs  Lab 08/25/23 1219 08/26/23 0648  WBC 4.8 4.0   Microbiology No results found for this or any previous visit (from the past 240 hours).  Procedures/Studies: MR BRAIN WO CONTRAST Result Date: 08/26/2023 CLINICAL DATA:  Rule out a stroke EXAM: MRI HEAD WITHOUT CONTRAST TECHNIQUE: Multiplanar, multiecho pulse sequences of the brain and surrounding structures were obtained without intravenous contrast. COMPARISON:  CT head May 24, 25. MRI head 11/04/2011 FINDINGS: Brain: No acute infarction, hemorrhage, hydrocephalus, extra-axial collection or mass lesion. Mild scattered T2/FLAIR hyperintensities in the white matter, which are nonspecific but compatible with chronic microvascular ischemic disease. Vascular: Normal flow voids. Skull and upper cervical spine: Normal marrow signal. Sinuses/Orbits: Clear sinuses.  No acute orbital findings. Other: No mastoid effusions. IMPRESSION: No evidence of acute intracranial abnormality. Electronically Signed   By: Stevenson Elbe M.D.   On: 08/26/2023 03:51   CT HEAD WO CONTRAST Result Date: 08/25/2023 CLINICAL DATA:  Neuro deficit, acute, stroke suspected EXAM: CT HEAD WITHOUT CONTRAST TECHNIQUE: Contiguous axial images were obtained from the base of the skull through the vertex without intravenous contrast. RADIATION DOSE REDUCTION: This exam was performed according to the departmental dose-optimization program which includes automated exposure control, adjustment of the mA and/or kV according to patient size and/or use of iterative reconstruction technique. COMPARISON:  Head CT 03/24/2018  FINDINGS: Brain: No intracranial hemorrhage, mass effect, or midline shift. No hydrocephalus. The basilar cisterns are patent. No evidence of territorial infarct or acute ischemia. No extra-axial or intracranial fluid collection. Vascular: Atherosclerosis of skullbase vasculature without hyperdense vessel or abnormal calcification. Skull: No fracture or focal lesion. Sinuses/Orbits: No acute finding. Chronic hyperdensity in the  right globe. Other: None. IMPRESSION: No acute intracranial abnormality. Electronically Signed   By: Chadwick Colonel M.D.   On: 08/25/2023 14:22     Time coordinating discharge: Over 30 minutes    Faith Homes, MD  [facility] 08/26/2023, 1:39 PM

## 2023-08-26 NOTE — TOC CAGE-AID Note (Signed)
 Transition of Care [facility]) - CAGE-AID Screening   Patient Details  Name: Jill Foley MRN: [id number] Date of Birth: 03/28/1966  Transition of Care [facility]) CM/SW Contact:    Kaci Freel [name], LCSW Phone Number: 08/26/2023, 9:49 AM   Clinical Narrative:    CAGE-AID Screening:    Have You Ever Felt You Ought to Cut Down on Your Drinking or Drug Use?: No Have People Annoyed You By [employer] Your Drinking Or Drug Use?: No Have You Felt Bad Or Guilty About Your Drinking Or Drug Use?: No Have You Ever Had a Drink or Used Drugs First Thing In The Morning to Steady Your Nerves or to Get Rid of a Hangover?: No CAGE-AID Score: 0  Substance Abuse Education Offered: No

## 2023-08-26 NOTE — Progress Notes (Signed)
 STROKE TEAM PROGRESS NOTE    58 y.o. 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  However, in speaking with the patient this morning, she denies ever  having any change in her vision.  She states that her presenting symptoms were more of a "tightness around her left eye" and that it was more like an irritation to the eye itself.  Patient has a history of right eye blindness and complete loss of peripheral vision in left due to diabetic retinopathy.  She has also had eyelid lifting surgery and states that her eyes do not close all the way and that she often wakes up with pretty dry eyes. MRI Negative.  Patient has history of TIA in 2013 and follows with outpatient neurology, last visit 2017.  No indication to change antithrombotics at this time.  OBJECTIVE  CBC    Component Value Date/Time   WBC 4.0 08/26/2023 0648   RBC 5.44 (H) 08/26/2023 0648   HGB 14.1 08/26/2023 0648   HCT 44.8 08/26/2023 0648   PLT 299 08/26/2023 0648   MCV 82.4 08/26/2023 0648   MCH 25.9 (L) 08/26/2023 0648   MCHC 31.5 08/26/2023 0648   RDW 14.0 08/26/2023 0648   LYMPHSABS 1.2 08/25/2023 1219   MONOABS 0.6 08/25/2023 1219   EOSABS 0.2 08/25/2023 1219   BASOSABS 0.0 08/25/2023 1219    BMET    Component Value Date/Time   NA 142 08/26/2023 0648   K 4.6 08/26/2023 0648   CL 105 08/26/2023 0648   CO2 27 08/26/2023 0648   GLUCOSE 100 (H) 08/26/2023 0648   BUN 10 08/26/2023 0648   CREATININE 1.05 (H) 08/26/2023 0648   CALCIUM 9.7 08/26/2023 0648   GFRNONAA >60 08/26/2023 0648    IMAGING past 24 hours MR BRAIN WO CONTRAST Result Date: 08/26/2023 CLINICAL DATA:  Rule out a stroke EXAM: MRI HEAD WITHOUT CONTRAST TECHNIQUE: Multiplanar, multiecho pulse sequences of the brain and surrounding structures were obtained  without intravenous contrast. COMPARISON:  CT head May 24, 25. MRI head 11/04/2011 FINDINGS: Brain: No acute infarction, hemorrhage, hydrocephalus, extra-axial collection or mass lesion. Mild scattered T2/FLAIR hyperintensities in the white matter, which are nonspecific but compatible with chronic microvascular ischemic disease. Vascular: Normal flow voids. Skull and upper cervical spine: Normal marrow signal. Sinuses/Orbits: Clear sinuses.  No acute orbital findings. Other: No mastoid effusions. IMPRESSION: No evidence of acute intracranial abnormality. Electronically Signed   By: Stevenson Elbe M.D.   On: 08/26/2023 03:51    Vitals:   08/26/23 0001 08/26/23 0404 08/26/23 0810 08/26/23 1200  BP: (!) 140/71 136/68 (!) 150/77 (!) 150/73  Pulse: 86 80 93 85  Resp: 18     Temp: 97.7 F (36.5 C) 97.8 F (36.6 C) 97.8 F (36.6 C) 97.8 F (36.6 C)  TempSrc: Oral Oral Oral Oral  SpO2: 99% 99% 99% 100%  Weight:      Height:        PHYSICAL EXAM General:  Alert, well-nourished, well-developed patient in no acute distress Psych:  Mood and affect appropriate for situation CV: Regular rate and rhythm on monitor Respiratory:  Regular, unlabored respirations on room air GI: Abdomen soft and nontender  NEURO:  Mental Status: AA&Ox3, patient is able to give clear and coherent history Speech/Language: speech is without dysarthria or aphasia.  Naming, repetition, fluency, and comprehension intact.  Cranial Nerves:  II: Right eye blind at baseline.  Loss of peripheral vision to left eye at baseline. (Patient states that she does not drive) III, IV, VI: Eyelids elevate symmetrically.  V: Sensation is intact to light touch and symmetrical to face.  VII: Face is symmetrical resting and smiling VIII: hearing intact to voice. IX, X:Phonation is normal.  HQ:IONGEXBM shrug 5/5. XII: tongue is midline  Motor: 5/5 strength to all muscle groups tested. No drift.  Tone: is normal and bulk is  normal Sensation- Intact to light touch bilaterally.  Coordination: FTN intact bilaterally, HKS Gait- deferred  Most Recent NIH: 1    ASSESSMENT/PLAN  Jill Foley is a 58 y.o. 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 However, on speaking with patient today, she denied any change to her vision and endorses the presenting symptoms as a tightness in the skin in front of her eye and a feeling of irritation to her eye itself. Patient states her eyes do not close all the way s/p eyelid lifting and that she often wakes up with pretty dry eyes. NIH on Admission: 1  Likely conjunctival irritation  CT head; No acute abnormality.   MRI   Negative  LDL 74 HgbA1c 7.5 VTE prophylaxis - lovenox  aspirin  81 mg daily prior to admission, continue No changes to antithrombotics indicated as no stroke seen on MRI.   Discussed stroke risk actors and secondary stroke prevention thoroughly at bedside  Hx of Stroke/TIA TIA 2013 Reported tingling in right ear, right arm, right leg MRI at the time showed questionable left pontine diffusion Patient was started on daily aspirin . Follows with Dr. Janett Medin outpatient (last visit 05/17/2015)  Hypertension Home meds: Lisinopril-hydrochlorothiazide daily Stable SBP goal normotensive  Hyperlipidemia Home meds: Pravastatin 40 mg LDL 74, goal < 70 Continue statin at discharge  Diabetes type II Uncontrolled Home meds: Januvia HgbA1c 7.5, goal < 7.0 CBGs SSI Recommend close follow-up with PCP for better DM control  Dysphagia Patient has post-stroke dysphagia, SLP consulted    Diet   Diet heart healthy/carb modified Room service appropriate? Yes; Fluid consistency: Thin   Advance diet as tolerated  Other Stroke Risk Factors Obesity, Body mass  index is 31.14 kg/m., BMI >/= 30 associated with increased stroke risk, recommend weight loss, diet and exercise as appropriate  Family hx stroke (mother)   Hospital day # 0   Pt seen by Neuro NP/APP with MD. Note/plan to be edited by MD as needed.    Audrene Lease, DNP [facility] Please use AMION for contact information & EPIC for messaging.   To contact Stroke Continuity provider, please refer to [email or web address]. After hours, contact General Neurology

## 2023-09-12 DIAGNOSIS — Z8673 Personal history of transient ischemic attack (TIA), and cerebral infarction without residual deficits: Secondary | ICD-10-CM | POA: Insufficient documentation

## 2023-09-19 DIAGNOSIS — L988 Other specified disorders of the skin and subcutaneous tissue: Secondary | ICD-10-CM | POA: Insufficient documentation

## 2023-09-19 DIAGNOSIS — Q828 Other specified congenital malformations of skin: Secondary | ICD-10-CM | POA: Insufficient documentation

## 2024-02-27 ENCOUNTER — Emergency Department (HOSPITAL_BASED_OUTPATIENT_CLINIC_OR_DEPARTMENT_OTHER)

## 2024-02-27 ENCOUNTER — Other Ambulatory Visit: Payer: Self-pay

## 2024-02-27 ENCOUNTER — Emergency Department (HOSPITAL_BASED_OUTPATIENT_CLINIC_OR_DEPARTMENT_OTHER)
Admission: EM | Admit: 2024-02-27 | Discharge: 2024-02-27 | Disposition: A | Discharge status: Home / Self Care | Attending: Emergency Medicine | Admitting: Emergency Medicine

## 2024-02-27 ENCOUNTER — Encounter (HOSPITAL_BASED_OUTPATIENT_CLINIC_OR_DEPARTMENT_OTHER): Payer: Self-pay | Admitting: Emergency Medicine

## 2024-02-27 DIAGNOSIS — Z79899 Other long term (current) drug therapy: Secondary | ICD-10-CM | POA: Diagnosis not present

## 2024-02-27 DIAGNOSIS — Z7982 Long term (current) use of aspirin: Secondary | ICD-10-CM | POA: Insufficient documentation

## 2024-02-27 DIAGNOSIS — Z7901 Long term (current) use of anticoagulants: Secondary | ICD-10-CM | POA: Insufficient documentation

## 2024-02-27 DIAGNOSIS — G459 Transient cerebral ischemic attack, unspecified: Secondary | ICD-10-CM | POA: Insufficient documentation

## 2024-02-27 DIAGNOSIS — E119 Type 2 diabetes mellitus without complications: Secondary | ICD-10-CM | POA: Insufficient documentation

## 2024-02-27 DIAGNOSIS — I10 Essential (primary) hypertension: Secondary | ICD-10-CM | POA: Diagnosis not present

## 2024-02-27 DIAGNOSIS — E876 Hypokalemia: Secondary | ICD-10-CM | POA: Diagnosis not present

## 2024-02-27 DIAGNOSIS — R202 Paresthesia of skin: Secondary | ICD-10-CM | POA: Diagnosis present

## 2024-02-27 LAB — CBC
HCT: 41.6 % (ref 36.0–46.0)
Hemoglobin: 13.2 g/dL (ref 12.0–15.0)
MCH: 26.3 pg (ref 26.0–34.0)
MCHC: 31.7 g/dL (ref 30.0–36.0)
MCV: 83 fL (ref 80.0–100.0)
Platelets: 304 K/uL (ref 150–400)
RBC: 5.01 MIL/uL (ref 3.87–5.11)
RDW: 14.4 % (ref 11.5–15.5)
WBC: 5.9 K/uL (ref 4.0–10.5)
nRBC: 0 % (ref 0.0–0.2)

## 2024-02-27 LAB — COMPREHENSIVE METABOLIC PANEL WITH GFR
ALT: 28 U/L (ref 0–44)
AST: 38 U/L (ref 15–41)
Albumin: 4.5 g/dL (ref 3.5–5.0)
Alkaline Phosphatase: 93 U/L (ref 38–126)
Anion gap: 12 (ref 5–15)
BUN: 18 mg/dL (ref 6–20)
CO2: 29 mmol/L (ref 22–32)
Calcium: 10 mg/dL (ref 8.9–10.3)
Chloride: 98 mmol/L (ref 98–111)
Creatinine, Ser: 1.02 mg/dL — ABNORMAL HIGH (ref 0.44–1.00)
GFR, Estimated: >60 mL/min (ref >60)
Glucose, Bld: 151 mg/dL — ABNORMAL HIGH (ref 70–99)
Potassium: 3.3 mmol/L — ABNORMAL LOW (ref 3.5–5.1)
Sodium: 139 mmol/L (ref 135–145)
Total Bilirubin: 0.4 mg/dL (ref 0.0–1.2)
Total Protein: 8 g/dL (ref 6.5–8.1)

## 2024-02-27 LAB — DIFFERENTIAL
Abs Immature Granulocytes: 0.02 K/uL (ref 0.00–0.07)
Basophils Absolute: 0 K/uL (ref 0.0–0.1)
Basophils Relative: 1 %
Eosinophils Absolute: 0.1 K/uL (ref 0.0–0.5)
Eosinophils Relative: 2 %
Immature Granulocytes: 0 %
Lymphocytes Relative: 20 %
Lymphs Abs: 1.2 K/uL (ref 0.7–4.0)
Monocytes Absolute: 0.7 K/uL (ref 0.1–1.0)
Monocytes Relative: 12 %
Neutro Abs: 3.8 K/uL (ref 1.7–7.7)
Neutrophils Relative %: 65 %

## 2024-02-27 LAB — URINE DRUG SCREEN
Amphetamines: NEGATIVE
Barbiturates: NEGATIVE
Benzodiazepines: NEGATIVE
Cocaine: NEGATIVE
Fentanyl: NEGATIVE
Methadone Scn, Ur: NEGATIVE
Opiates: NEGATIVE
Tetrahydrocannabinol: NEGATIVE

## 2024-02-27 LAB — PROTIME-INR
INR: 1 (ref 0.8–1.2)
Prothrombin Time: 13.8 s (ref 11.4–15.2)

## 2024-02-27 LAB — APTT: aPTT: 30 s (ref 24–36)

## 2024-02-27 LAB — CBG MONITORING, ED: Glucose-Capillary: 144 mg/dL — ABNORMAL HIGH (ref 70–99)

## 2024-02-27 MED ORDER — DIPHENHYDRAMINE HCL 25 MG PO CAPS
50.0000 mg | ORAL_CAPSULE | Freq: Once | ORAL | Status: AC
  Administered 2024-02-27: 50 mg via ORAL
  Filled 2024-02-27: qty 2

## 2024-02-27 MED ORDER — IOHEXOL 350 MG/ML SOLN
75.0000 mL | Freq: Once | INTRAVENOUS | Status: AC | PRN
  Administered 2024-02-27: 75 mL via INTRAVENOUS

## 2024-02-27 MED ORDER — METHYLPREDNISOLONE SODIUM SUCC 40 MG IJ SOLR
40.0000 mg | Freq: Once | INTRAMUSCULAR | Status: AC
  Administered 2024-02-27: 40 mg via INTRAVENOUS
  Filled 2024-02-27: qty 1

## 2024-02-27 MED ORDER — LACTATED RINGERS IV BOLUS
1000.0000 mL | Freq: Once | INTRAVENOUS | Status: AC
  Administered 2024-02-27: 1000 mL via INTRAVENOUS

## 2024-02-27 MED ORDER — CLOPIDOGREL BISULFATE 75 MG PO TABS: 75.0000 mg | ORAL_TABLET | Freq: Every day | ORAL | 0 refills | Status: AC

## 2024-02-27 MED ORDER — CLOPIDOGREL BISULFATE 300 MG PO TABS
300.0000 mg | ORAL_TABLET | Freq: Once | ORAL | Status: AC
  Administered 2024-02-27: 300 mg via ORAL
  Filled 2024-02-27: qty 1

## 2024-02-27 MED ORDER — ASPIRIN 81 MG PO CHEW: 81.0000 mg | CHEWABLE_TABLET | Freq: Every day | ORAL | 0 refills | Status: AC

## 2024-02-27 MED ORDER — DIPHENHYDRAMINE HCL 50 MG/ML IJ SOLN
50.0000 mg | Freq: Once | INTRAMUSCULAR | Status: AC
  Filled 2024-02-27: qty 1

## 2024-02-27 NOTE — ED Provider Notes (Signed)
 [facility] Provider Note   CSN: [id number] Arrival date & time: 02/27/24  1346     Patient presents with: Tingling   Jill Foley is a 58 y.o. female.  {Add pertinent medical, surgical, social history, OB history to HPI:32947} HPI  Patient is a 58 year old female with past medical history significant for DM, HTN, HLD history of TIA daily 81 mg aspirin  for weeks is in emergency room today with mostly resolved paresthesias on the left upper and lower extremities. She describes these as tingling sensations (not loss of sensation).   No fever, neck pain, vision changes.   No limb weakness or slurred speech, confusion or numbness.      Prior to Admission medications   Medication Sig Start Date End Date Taking? Authorizing Provider  aspirin  81 MG chewable tablet Chew 1 tablet (81 mg total) by mouth daily. 02/27/24  Yes Cambryn Charters, Hamp S, PA  clopidogrel  (PLAVIX ) 75 MG tablet Take 1 tablet (75 mg total) by mouth daily. 02/27/24  Yes Bence Trapp S, PA  BIOTIN PO Take 10,000 Units by mouth daily.     [provider]  Calcium Carb-Cholecalciferol 600-800 MG-UNIT TABS Take 1 tablet by mouth daily.    [provider]  cholecalciferol (VITAMIN D3) 25 MCG (1000 UT) tablet Take 2,000 Units by mouth daily.    [provider]  ferrous sulfate 325 (65 FE) MG tablet Take by mouth.    [provider]  ketotifen (ALAWAY) 0.025 % ophthalmic solution Apply to eye. 05/04/15   [provider]  lisinopril-hydrochlorothiazide (PRINZIDE,ZESTORETIC) 20-12.5 MG per tablet Take 1 tablet by mouth daily.    [provider]  Magnesium Oxide 250 MG TABS Take 1 tablet by mouth daily.    [provider]  methylcellulose (ARTIFICIAL TEARS) 1 % ophthalmic solution Place 1 drop into both eyes 2 (two) times daily.    [provider]  Multiple Vitamin (MULTIVITAMIN) capsule Take 1 capsule by  mouth daily.    [provider]  ondansetron  (ZOFRAN  ODT) 4 MG disintegrating tablet Take 1 tablet (4 mg total) by mouth every 8 (eight) hours as needed for nausea or vomiting. 12/01/20   Neldon Hamp RAMAN, PA  pravastatin  (PRAVACHOL ) 40 MG tablet Take 40 mg by mouth daily.    [provider]  sitaGLIPtin (JANUVIA) 50 MG tablet Take 50 mg by mouth daily.    [provider]    Allergies: Fish allergy, Ivp dye [iodinated contrast media], and Xalatan [latanoprost]    Review of Systems  Updated Vital Signs BP 139/77 (BP Location: Right Arm)   Pulse 95   Temp 98.5 F (36.9 C) (Oral)   Resp 16   Ht 5' 9 (1.753 m)   Wt 99.3 kg   LMP 09/07/2010   SpO2 100%   BMI 32.33 kg/m   Physical Exam Vitals and nursing note reviewed.  Constitutional:      General: She is not in acute distress. HENT:     Head: Normocephalic and atraumatic.     Nose: Nose normal.  Eyes:     General: No scleral icterus. Cardiovascular:     Rate and Rhythm: Normal rate and regular rhythm.     Pulses: Normal pulses.     Heart sounds: Normal heart sounds.  Pulmonary:     Effort: Pulmonary effort is normal. No respiratory distress.     Breath sounds: No wheezing.  Abdominal:     Palpations: Abdomen is  soft.     Tenderness: There is no abdominal tenderness.  Musculoskeletal:     Cervical back: Normal range of motion.     Right lower leg: No edema.     Left lower leg: No edema.  Skin:    General: Skin is warm and dry.     Capillary Refill: Capillary refill takes less than 2 seconds.  Neurological:     Mental Status: She is alert. Mental status is at baseline.     Comments: Alert and oriented to self, place, time and event.   Speech is fluent, clear without dysarthria or dysphasia.   Strength 5/5 in upper/lower extremities  Sensation intact in upper/lower extremities   Normal gait.  Negative Romberg. No pronator drift.  Normal finger-to-nose and feet tapping.  CN I not tested   CN II grossly intact visual fields bilaterally. Did not visualize posterior eye.  CN III, IV, VI PERRLA and EOMs intact bilaterally  CN V Intact sensation to sharp and light touch to the face  CN VII facial movements symmetric  CN VIII not tested  CN IX, X no uvula deviation, symmetric rise of soft palate  CN XI 5/5 SCM and trapezius strength bilaterally  CN XII Midline tongue protrusion, symmetric L/R movements    Psychiatric:        Mood and Affect: Mood normal.        Behavior: Behavior normal.     (all labs ordered are listed, but only abnormal results are displayed) Labs Reviewed  COMPREHENSIVE METABOLIC PANEL WITH GFR - Abnormal; Notable for the following components:      Result Value   Potassium 3.3 (*)    Glucose, Bld 151 (*)    Creatinine, Ser 1.02 (*)    All other components within normal limits  CBG MONITORING, ED - Abnormal; Notable for the following components:   Glucose-Capillary 144 (*)    All other components within normal limits  PROTIME-INR  APTT  CBC  DIFFERENTIAL  URINE DRUG SCREEN    EKG: EKG Interpretation Date/Time:  Wednesday February 27 2024 13:55:15 EST Ventricular Rate:  95 PR Interval:  167 QRS Duration:  101 QT Interval:  355 QTC Calculation: 447 R Axis:   27  Text Interpretation: Sinus rhythm Consider left atrial enlargement Low voltage, precordial leads Confirmed by Ruthe Cornet [phone]) on 02/27/2024 1:59:49 PM  Radiology: CT ANGIO HEAD NECK W WO CM Result Date: 02/27/2024 EXAM: CT HEAD WITHOUT CTA HEAD AND NECK WITH AND WITHOUT 02/27/2024 07:06:48 PM TECHNIQUE: CTA of the head and neck was performed with and without the administration of 75 mL of iohexol  (OMNIPAQUE ) 350 MG/ML intravenous contrast. Noncontrast CT of the head with reconstructed 2-D images are also provided for review. Multiplanar 2D and/or 3D reformatted images are provided for review. Automated exposure control, iterative reconstruction, and/or weight based adjustment  of the mA/kV was utilized to reduce the radiation dose to as low as reasonably achievable. COMPARISON: Head CT 08/25/2023 and MRI 08/26/2023 CLINICAL HISTORY: Transient ischemic attack (TIA); Left arm and left leg tingling -- improving but still present. FINDINGS: CT HEAD: BRAIN AND VENTRICLES: Assessment of the brain parenchyma is limited by extensive image noise. No gross acute large territory infarct, intracranial hemorrhage, mass, midline shift, hydrocephalus, or extra axial fluid collection is identified. Cerebral volume is unchanged. Calcified atherosclerosis at the skull base. ORBITS: Postoperative changes to the globes. SINUSES AND MASTOIDS: Minimal mucosal thickening in the left maxillary sinus. Clear mastoid air cells. CTA NECK: AORTIC ARCH  AND ARCH VESSELS: Normal variant aortic arch branching pattern with common origin of the brachiocephalic and left common carotid arteries. No dissection or arterial injury. No significant stenosis of the brachiocephalic or subclavian arteries. CERVICAL CAROTID ARTERIES: Moderate atherosclerotic calcification about the carotid bifurcations. Severe stenosis of the right ECA origin. No significant stenosis of the right common carotid artery or right internal carotid artery. 65% stenosis of the distal left common carotid artery. No significant stenosis of the left cervical ICA. No dissection or arterial injury. CERVICAL VERTEBRAL ARTERIES: Mildly dominant right vertebral artery. No dissection, arterial injury, or significant stenosis. LUNGS AND MEDIASTINUM: Partially visualized patulous esophagus containing fluid/debris. SOFT TISSUES: No acute abnormality. BONES: Mild cervical spondylosis. Focally advanced facet arthrosis on the left at C3-C4. CTA HEAD: ANTERIOR CIRCULATION: The intracranial internal carotid arteries are patent with calcified plaque resulting in mild cavernous segment stenoses bilaterally. ACAs and MCAs are patent without evidence of a proximal branch  occlusion or significant proximal stenosis. The right A1 segment is hypoplastic, and there is an azygos A2 segment. No aneurysm. POSTERIOR CIRCULATION: The intracranial vertebral arteries are widely patent to the basilar. The basilar artery is widely patent. Posterior communicating arteries are diminutive or absent. Both PCAs are patent without evidence of a significant proximal stenosis. No aneurysm. OTHER: No dural venous sinus thrombosis on this non-dedicated study. IMPRESSION: 1. No gross acute intracranial abnormality on this limited noncontrast head CT. 2. Atherosclerosis in the head and neck without a large vessel occlusion. 3. 65% stenosis of the distal left common carotid artery. 4. Mild bilateral intracranial ICA stenoses. Electronically signed by: Dasie Hamburg MD 02/27/2024 07:56 PM EST RP Workstation: [id number]    {Document cardiac monitor, telemetry assessment procedure when appropriate:32947} Procedures   Medications Ordered in the ED  lactated ringers  bolus 1,000 mL (0 mLs Intravenous Stopped 02/27/24 1836)  methylPREDNISolone  sodium succinate (SOLU-MEDROL ) 40 mg/mL injection 40 mg (40 mg Intravenous Given 02/27/24 1447)  diphenhydrAMINE  (BENADRYL ) capsule 50 mg (50 mg Oral Given 02/27/24 1740)    Or  diphenhydrAMINE  (BENADRYL ) injection 50 mg ( Intravenous See Alternative 02/27/24 1740)  iohexol  (OMNIPAQUE ) 350 MG/ML injection 75 mL (75 mLs Intravenous Contrast Given 02/27/24 1851)  clopidogrel  (PLAVIX ) tablet 300 mg (300 mg Oral Given 02/27/24 2116)    Clinical Course as of 02/27/24 2218  Wed Feb 27, 2024  1423 Last night around midnight pt states she felt tingling in L arm and leg -- much improved this AM when she woke up but still present.   [WF]  1901 Patient going for CT [WF]  2035 Discussed with tele-neurology who will see pt [WF]  2100 ABCD score 3. Tingling w/o other symptoms. No migraine symptoms. Assuming TIA. Little narrowing in common carotid bu unlreated. Can follow  up with carotid u/s. Echo. MRI. Start on plavix . 300 here. 75 daily home. Aspirin  as well for thirty days. And then Plavix  75 daily.   Need loop recorder/event monitor.  [TL]    Clinical Course User Index [TL] Simon Lavonia SAILOR, MD [WF] Neldon Hamp RAMAN, [place]   {Click here for ABCD2, HEART and other calculators REFRESH Note before signing:1}                              Medical Decision Making Amount and/or Complexity of Data Reviewed Labs: ordered. Radiology: ordered.  Risk OTC drugs. Prescription drug management.   This patient presents to the ED for concern of paresthesias, this involves a  number of treatment options, and is a complaint that carries with it a moderate risk of complications and morbidity. A differential diagnosis was considered for the patient's symptoms which is discussed below:   The differential diagnosis of paresthesias includes but is not limited un:Jornynopdf, diabetic neuropathy, es mellitus, entrapment neuropathy, eg, carpal tunnel syndrome, tarsal tunnel syndrome, meralgia paresthetica) hypocalcemia, multiple sclerosis, spinal cord lesion, nerve root compression, herpes zoster, transient ischemic attack, Guillain-Barr syndrome, trigeminal neuralgia, migraine, partial seizure, reflex sympathetic dystrophy, thoracic outlet syndrome, brachial plexus neuropathy.    Co morbidities: Discussed in HPI   Brief History:  Patient is a 58 year old female with past medical history significant for DM, HTN, HLD history of TIA daily 81 mg aspirin  for weeks is in emergency room today with mostly resolved paresthesias on the left upper and lower extremities. She describes these as tingling sensations (not loss of sensation).   No fever, neck pain, vision changes.   No limb weakness or slurred speech, confusion or numbness.     EMR reviewed including pt PMHx, past surgical history and past visits to ER.   See HPI for more details   Lab Tests:  I personally reviewed  all laboratory work and imaging. Metabolic panel without any acute abnormality specifically kidney function within normal limits and no significant electrolyte abnormalities. CBC without leukocytosis or significant anemia. Labs unremarkable  Imaging Studies:  NAD. I personally reviewed all imaging studies and no acute abnormality found. I agree with radiology interpretation. No acute disease, does have some atherosclerosis noted but thought to be incidental per neurology   Cardiac Monitoring:  .The patient was maintained on a cardiac monitor.  I personally viewed and interpreted the cardiac monitored which showed an underlying rhythm of: NSR .EKG non-ischemic   Medicines ordered:  I ordered medication including Plavix , LR, Benadryl , Solu-Medrol  for antiplatelet therapy, hydration, pretreatment for CT with contrast Reevaluation of the patient after these medicines showed that the patient stayed the same I have reviewed the patients home medicines and have made adjustments as needed   Critical Interventions:  .   Consults/Attending Physician   .I requested consultation with Laurence of neurology,  and discussed lab and imaging findings as well as pertinent plan - they recommend: TIA workup outpatient   Reevaluation:  After the interventions noted above I re-evaluated patient and found that they have :resolved   Social Determinants of Health:  .    Problem List / ED Course:  Patient with normal neurologic exam with some paresthesias of left upper and left lower extremity seems to be in the forearm specifically in the dorsal forearm and left lower extremity.  CT angio unremarkable apart from the sign carotid plaque.  Her symptoms are completely resolved at this time.  Neurology recommends Plavix    Dispostion:  After consideration of the diagnostic results and the patients response to treatment, I feel that the patent would benefit from ***   Final diagnoses:  TIA  (transient ischemic attack)    ED Discharge Orders          Ordered    clopidogrel  (PLAVIX ) 75 MG tablet  Daily        02/27/24 2107    aspirin  81 MG chewable tablet  Daily        02/27/24 2107    Ambulatory referral to Neurology       Comments: An appointment is requested in approximately: 1 week   02/27/24 2110

## 2024-02-27 NOTE — ED Triage Notes (Signed)
 Referred by [facility] for possible TIA. Woke up around 0800 with tingling to left arm and lower leg. Speech clear, gait steady

## 2024-02-27 NOTE — Consult Note (Addendum)
 [facility] Consult Services  Stat Consult  Patient Name:   Jill Foley, Jill Foley Date of Birth:   [birth year] Identification Number:   MRN - [id number] Date of Service:   02/27/2024 20:25:48  Diagnosis:       G45.9 - Transient cerebral ischemic attack, unspecified  Impression The patient is 58yo F with h/o DM, HTN, HLD, has a h/o TIA on asa, presents with paresthesias on the L side. She is back to baseline and not a candidate for acute stroke interventions. CTA h/n shows no acute findings, with note of 65% stenosis of L common carotid but is likely incidental as it does not correlate with her symptoms. No LVO found. She did not have any migraine symptoms, however has had several episodes of L sided TIAs in the past. I suspect TIA is again most likely therefore will have patient on dual antiplatelet therapy with Plavix  (Loading dose 300mg  once followed by 75mg  daily) and low dose aspirin  81mg  daily for at 30 days and afterwards switch to Antiplatelet monotherapy with plavix  75mg  alone. Outpatient testing is reasonable given ABCD2 risk of 3. However, Further testing should be done as soon as feasible within next 2-3 weeks including carotid duplex, echocardiogram with bubble, MRI brain wo contrast, rhythm monitoring with loop recorder or event monitor and followup with PCP with lipid panel, A1c.     Recommendations: Our recommendations are outlined below.  Laboratory Studies : Lipid panel Hemoglobin A1c  Nursing Recommendations : IV Fluids, avoid dextrose  containing fluids, Maintain euglycemia Neuro checks q4 hrs x 24 hrs and then per shift Head of bed 30 degrees Continue with Telemetry  Consultations : Recommend Speech therapy if failed dysphagia screen Physical therapy/Occupational therapy  DVT Prophylaxis : Choice of Primary Team  Miscellaneous : carotid duplex, echocardiogram with bubble, MRI brain wo contrast, rhythm monitoring with loop  recorder or event monitor and followup with PCP in 2-3 weeks    ----------------------------------------------------------------------------------------------------   Advanced Imaging: CTA Head and Neck Completed.  LVO:No  Patient is not a candidate for NIR    Metrics: Callback Response Time: 02/27/2024 20:28:52  Primary Provider Notified of Diagnostic Impression and Management Plan on: 02/27/2024 21:07:12   CT HEAD:  no acute findings    ----------------------------------------------------------------------------------------------------  Chief Complaint: L sided tingling  History of Present Illness: Patient is a 58 year old Female. 75 F Pt presents with tingling to left arm and left leg that started last night at 2200 through this morning, resolved over past 6 hrs. Currently no tingling at all. No tingling on the face. It was the upper left arm and lower left leg. No weakness, just tingling. No neck or back pain. Hx TIA work up in the past with no strokes. In 2013, she had sensation difference from head to toe on the L side. It was a stressful time when her mom passed. She denies any migraines (twice in her life) and No headache or photophobia or dizziness recently. In May she had tightness in the corner of L eye but no blurriness. She had an MRI that was normal in May.  Hx: DM, HTN, HLD, TLKW: <24 hrs Imaging: CTA- stenosis of left common carotid.     Past Medical History:      Hypertension      Diabetes Mellitus      Hyperlipidemia      There is no history of Coronary Artery Disease Other PMH:  TIA  Medications:  No Anticoagulant use  Antiplatelet use: Yes asa  Reviewed EMR for current medications  Allergies:  Reviewed  Social History: Smoking: No Alcohol Use: No  Family History:  There is no family history of premature cerebrovascular disease pertinent to this consultation  ROS : 14 Points Review of Systems was performed and was negative  except mentioned in HPI.  Past Surgical History: There Is No Surgical History Contributory To Today's Visit   Examination: BP(139/77), Pulse(95), 1A: Level of Consciousness - Alert; keenly responsive + 0 1B: Ask Month and Age - Both Questions Right + 0 1C: Blink Eyes & Squeeze Hands - Performs Both Tasks + 0 2: Test Horizontal Extraocular Movements - Normal + 0 3: Test Visual Fields - No Visual Loss + 0 4: Test Facial Palsy (Use Grimace if Obtunded) - Normal symmetry + 0 5A: Test Left Arm Motor Drift - No Drift for 10 Seconds + 0 5B: Test Right Arm Motor Drift - No Drift for 10 Seconds + 0 6A: Test Left Leg Motor Drift - No Drift for 5 Seconds + 0 6B: Test Right Leg Motor Drift - No Drift for 5 Seconds + 0 7: Test Limb Ataxia (FNF/Heel-Shin) - No Ataxia + 0 8: Test Sensation - Normal; No sensory loss + 0 9: Test Language/Aphasia - Normal; No aphasia + 0 10: Test Dysarthria - Normal + 0 11: Test Extinction/Inattention - No abnormality + 0  NIHSS Score: 0  Spoke with : Dr Simon    This consult was conducted in real time using interactive audio and immunologist. Patient was informed of the technology being used for this visit and agreed to proceed. Patient located in hospital and provider located at home/office setting.  Patient is being evaluated for possible acute neurologic impairment and high probability of imminent or life - threatening deterioration.I spent total of 35 minutes providing care to this patient, including time for face to face visit via telemedicine, review of medical records, imaging studies and discussion of findings with providers, the patient and / or family.   Dr Daril Door     [facility] For Inpatient follow-up with [facility] physician please call [facility] at [phone]. As we are not an outpatient service for any post hospital discharge needs please contact the hospital for assistance.  If you have any questions for the [facility]  physicians or need to reconsult for clinical or diagnostic changes please contact us  via [facility] at [phone].  Non-radiologist review of imaging performed to assist with emergent clinical decision-making. Remote physician workstations do not possess the same resolution, calibration, or diagnostic capabilities as hospital-based radiology reading stations, and formal radiologist read is necessary.   Signature : Daril Door

## 2024-02-27 NOTE — Discharge Instructions (Addendum)
 We are assuming that your symptoms represent a TIA. You have a little narrowing in common carotid artery. You will need to follow up with your primary care provider to have carotid ultrasound, ultrasound of heart, and brain MRI. Start on plavix . 300 here. 75 daily home. Aspirin  as well for thirty days. And then Plavix  75 daily. Need loop recorder/event monitor ordered as well.   As we discussed, a TIA can be a harbinger of a stroke and if you develop any new symptoms such as slurred speech, confusion, headache, numbness or weakness of limb or face come back to the emergency room specifically I recommend that you go to [facility] or [facility].

## 2024-02-27 NOTE — ED Notes (Addendum)
 Report received from Anissa, Charity fundraiser. Assuming pt care at this time.

## 2024-02-27 NOTE — Progress Notes (Signed)
 Subjective Patient ID: Jill Foley is a 58 y.o. female.  Chief Complaint  Patient presents with  . Tingling    Patient here for Tingling in Upper Left arm and Lower Left Leg that she noticed this morning    The following information was reviewed by members of the visit team:  Tobacco  Allergies  Meds  Problems  OB Status     Patient Active Problem List  Diagnosis  . Type 2 diabetes mellitus with stage 3a chronic kidney disease    (CMD)  . Dyslipidemia associated with type 2 diabetes mellitus    (CMD)  . Hypertension associated with diabetes    (CMD)  . Metatarsalgia of both feet  . Type 2 diabetes mellitus with diabetic neuropathy    (CMD)  . Solitary thyroid nodule  . Achalasia  . Type 2 diabetes mellitus with retinopathy of both eyes    (CMD)  . Iron deficiency anemia  . Chronic rhinosinusitis  . Prolonged grief reaction  . Chronic kidney disease (CKD) stage G3a/A1, moderately decreased glomerular filtration rate (GFR) between 45-59 mL/min/1.73 square meter and albuminuria creatinine ratio less than 30 mg/g (CMD)  . Sinus tarsi syndrome of right ankle  . Dry skin dermatitis  . Chronic seasonal allergic rhinitis due to pollen  . Hammer toes of both feet  . Plantar keratosis, acquired  . Hypomagnesemia  . History of transient ischemic attack  . Acquired porokeratosis   Current Outpatient Medications  Medication Instructions  . amLODIPine (NORVASC) 2.5 mg, oral, Daily  . ascorbic acid (VITAMIN C) 500 mg, Daily  . aspirin  81 mg, Daily  . atorvastatin (LIPITOR) 40 mg, oral, Daily, For cholesterol  . biotin 10,000 mcg, Daily  . carboxymethylcellulose (Refresh Tears) 0.5 % drop ophthalmic solution 1 drop  . cholecalciferol (VITAMIN D3) 2,000 unit tablet 1 tablet, Daily  . diphenhydramine -zinc acetate (BENADRYL  EXTRA STRENGTH) 2-0.1 % cream 1 Application, topical, 4 times daily  . famotidine (PEPCID) 20 mg, oral, At  bedtime PRN  . fluconazole (DIFLUCAN) 150 mg,  oral, Once as needed  . glipiZIDE (GLUCOTROL XL) 2.5 mg, oral, Daily with breakfast  . hydroCHLOROthiazide (HYDRODIURIL) 12.5 mg, oral, Daily  . hydrocortisone 2.5 % cream topical, 2 times daily  . linaGLIPtin (TRADJENTA) 5 mg, oral, Every 24 hours  . loratadine (CLARITIN) 10 mg, oral, Daily  . metFORMIN (GLUCOPHAGE-XR) 1,000 mg, oral, At bedtime  . Miebo, PF, 100 % drop   . multivitamin cap 1 capsule  . ofloxacin (OCUFLOX) 0.3 % ophthalmic solution ofloxacin 0.3 % eye drops INSTILL 1 DROP INTO LEFT EYE 4 TIMES DAILY HAVE AVAILABLE IF CORNEAL ABRASION DEVELOPS IN STRONG SEEING EYE  . OneTouch Verio test strips test strip USE 1 STRIP TO CHECK GLUCOSE 4 TIMES DAILY  . pioglitazone (ACTOS) 15 mg, oral, Daily  . tiZANidine (ZANAFLEX) 2 mg capsule Take 1 pill PO BID prn muscle spasm  . triamcinolone acetonide (KENALOG) 0.5 % cream topical, 2 times daily, Apply topically twice daily  . vit A and D3 in cod liver oiL 1,250-135 unit cap cod liver oil  . Xiidra 5 % dpet ophthalmic solution INSTILL 1 DROP INTO EACH EYE TWICE DAILY AS DIRECTED   Allergies  Allergen Reactions  . Iodine-131 Itching and Cough  . Latanoprost Other (See Comments)    Developed redness and irritation of her eye     HPI 58 yo female with known DM with CKD stage 3, Hypertension, Hx of TIA's, and Dyslipidemia comes to the [facility]  today with c/o her left arm and leg feeling numb and tingling since last night. Symptoms were still present this morning when she got up, but now says she thinks they are gradually improving  She denies any headache, slurred speech, confusion, dizziness, facial numbness  no acute changes in her vision, ,  neck pain, back pain , loss of bowel/bladder or inability to walk  She says her BS today was 89 fasting.   Review of Systems  Constitutional:  Negative for diaphoresis.  HENT:  Negative for drooling, facial swelling and trouble swallowing.   Eyes:  Negative for photophobia, pain and visual  disturbance.  Respiratory:  Negative for cough and shortness of breath.   Cardiovascular:  Negative for chest pain.  Gastrointestinal:  Negative for abdominal pain.  Musculoskeletal:  Negative for gait problem.  Neurological:  Positive for numbness. Negative for dizziness, syncope, facial asymmetry, speech difficulty, weakness, light-headedness and headaches.  Psychiatric/Behavioral:  Negative for confusion.     Objective Blood pressure 138/70, pulse (!) 112, temperature 97.8 F (36.6 C), resp. rate 20, height 1.778 m (5' 10), weight 98.4 kg (217 lb), SpO2 94%.  Physical Exam Vitals and nursing note reviewed.  Constitutional:      General: She is not in acute distress.    Appearance: Normal appearance. She is not ill-appearing or diaphoretic.  HENT:     Right Ear: Tympanic membrane, ear canal and external ear normal. There is no impacted cerumen.     Left Ear: Tympanic membrane, ear canal and external ear normal. There is no impacted cerumen.     Nose: Nose normal.     Mouth/Throat:     Mouth: Mucous membranes are moist.  Eyes:     Extraocular Movements: Extraocular movements intact.     Conjunctiva/sclera: Conjunctivae normal.     Pupils: Pupils are equal, round, and reactive to light.  Cardiovascular:     Rate and Rhythm: Normal rate and regular rhythm.  Pulmonary:     Effort: Pulmonary effort is normal.     Breath sounds: Normal breath sounds.  Abdominal:     Palpations: Abdomen is soft.  Musculoskeletal:     Cervical back: Neck supple.  Skin:    General: Skin is warm and dry.  Neurological:     Mental Status: She is alert and oriented to person, place, and time.     Cranial Nerves: No facial asymmetry.     Sensory: Sensation is intact.     Motor: Weakness present. No pronator drift.     Coordination: Romberg sign negative. Heel to [facility] Test abnormal.     Gait: Tandem walk abnormal.     Deep Tendon Reflexes:     Reflex Scores:      Patellar reflexes are 2+ on the  right side and 1+ on the left side.      Achilles reflexes are 2+ on the right side and 1+ on the left side.    Comments: There is normal sensation in the ext to sharp The left lower leg is positive for weakness . She is  unable to balance on a single foot at a time. Difficulty standing on toes/hells on the left side Unable to perform a tandem gait Able to stand on toes Gait is mildly ataxic Negative Romberg        Assessment/Plan Diagnoses and all orders for this visit:  Numbness and tingling of left arm and leg  Stroke-like symptoms  She does have some neurological deficits on  exam today mainly in her left leg. None seen in the left arm. She may have already had a mild CVA since this started hours ago . Her VS are stable at this time I did advised that she go to the ER for further evaluation of her symptoms  Electronically signed: Inocente Fairly, PA-C 02/27/2024  1:13 PM

## 2024-02-29 ENCOUNTER — Encounter (HOSPITAL_COMMUNITY): Payer: Self-pay

## 2024-02-29 ENCOUNTER — Emergency Department (HOSPITAL_COMMUNITY)
Admission: EM | Admit: 2024-02-29 | Discharge: 2024-02-29 | Disposition: A | Discharge status: Home / Self Care | Attending: Emergency Medicine | Admitting: Emergency Medicine

## 2024-02-29 ENCOUNTER — Emergency Department (HOSPITAL_COMMUNITY)

## 2024-02-29 DIAGNOSIS — Z7982 Long term (current) use of aspirin: Secondary | ICD-10-CM | POA: Insufficient documentation

## 2024-02-29 DIAGNOSIS — Z7901 Long term (current) use of anticoagulants: Secondary | ICD-10-CM | POA: Insufficient documentation

## 2024-02-29 DIAGNOSIS — R202 Paresthesia of skin: Secondary | ICD-10-CM | POA: Diagnosis present

## 2024-02-29 LAB — CBC WITH DIFFERENTIAL/PLATELET
Abs Immature Granulocytes: 0.01 K/uL (ref 0.00–0.07)
Basophils Absolute: 0 K/uL (ref 0.0–0.1)
Basophils Relative: 1 %
Eosinophils Absolute: 0.1 K/uL (ref 0.0–0.5)
Eosinophils Relative: 2 %
HCT: 40.4 % (ref 36.0–46.0)
Hemoglobin: 12.5 g/dL (ref 12.0–15.0)
Immature Granulocytes: 0 %
Lymphocytes Relative: 20 %
Lymphs Abs: 0.9 K/uL (ref 0.7–4.0)
MCH: 26.1 pg (ref 26.0–34.0)
MCHC: 30.9 g/dL (ref 30.0–36.0)
MCV: 84.3 fL (ref 80.0–100.0)
Monocytes Absolute: 0.5 K/uL (ref 0.1–1.0)
Monocytes Relative: 10 %
Neutro Abs: 2.9 K/uL (ref 1.7–7.7)
Neutrophils Relative %: 67 %
Platelets: 302 K/uL (ref 150–400)
RBC: 4.79 MIL/uL (ref 3.87–5.11)
RDW: 14.4 % (ref 11.5–15.5)
WBC: 4.3 K/uL (ref 4.0–10.5)
nRBC: 0 % (ref 0.0–0.2)

## 2024-02-29 LAB — BASIC METABOLIC PANEL WITH GFR
Anion gap: 11 (ref 5–15)
BUN: 14 mg/dL (ref 6–20)
CO2: 28 mmol/L (ref 22–32)
Calcium: 9.1 mg/dL (ref 8.9–10.3)
Chloride: 102 mmol/L (ref 98–111)
Creatinine, Ser: 1.14 mg/dL — ABNORMAL HIGH (ref 0.44–1.00)
GFR, Estimated: 56 mL/min — ABNORMAL LOW (ref >60)
Glucose, Bld: 142 mg/dL — ABNORMAL HIGH (ref 70–99)
Potassium: 3.6 mmol/L (ref 3.5–5.1)
Sodium: 141 mmol/L (ref 135–145)

## 2024-02-29 NOTE — ED Provider Notes (Signed)
 [place] EMERGENCY DEPARTMENT AT [facility] Provider Note   CSN: [id number] Arrival date & time: 02/29/24  1342     Patient presents with: Arm Tingling   Jill Foley is a 58 y.o. female.   58 year old female presents with sudden onset of left upper extremity tingling which lasted for about 10 to 15 minutes.  No other associated symptoms.  She did not have any headache.  She had no weakness.  She had no speech changes.  She had no confusion.  Symptoms resolved spontaneously.  Was seen here 2 days ago for TIA workup.  Per old records, she had a negative CT head and neck.  Was seen by neurology and felt to be low risk and was prescribed Plavix  therapy.  Was prescribed Plavix  and she has not done that yet.  She has been asymptomatic at this time.       Prior to Admission medications   Medication Sig Start Date End Date Taking? Authorizing Provider  aspirin  81 MG chewable tablet Chew 1 tablet (81 mg total) by mouth daily. 02/27/24   Neldon Hamp RAMAN, PA  BIOTIN PO Take 10,000 Units by mouth daily.     [provider]  Calcium Carb-Cholecalciferol 600-800 MG-UNIT TABS Take 1 tablet by mouth daily.    [provider]  cholecalciferol (VITAMIN D3) 25 MCG (1000 UT) tablet Take 2,000 Units by mouth daily.    [provider]  clopidogrel  (PLAVIX ) 75 MG tablet Take 1 tablet (75 mg total) by mouth daily. 02/27/24   Neldon Hamp RAMAN, PA  ferrous sulfate 325 (65 FE) MG tablet Take by mouth.    [provider]  ketotifen (ALAWAY) 0.025 % ophthalmic solution Apply to eye. 05/04/15   [provider]  lisinopril-hydrochlorothiazide (PRINZIDE,ZESTORETIC) 20-12.5 MG per tablet Take 1 tablet by mouth daily.    [provider]  Magnesium Oxide 250 MG TABS Take 1 tablet by mouth daily.    [provider]  methylcellulose (ARTIFICIAL TEARS) 1 % ophthalmic solution Place 1 drop into both eyes 2 (two) times daily.    [provider]  Multiple Vitamin (MULTIVITAMIN) capsule Take 1 capsule by mouth daily.    [provider]  ondansetron  (ZOFRAN  ODT) 4 MG disintegrating tablet Take 1 tablet (4 mg total) by mouth every 8 (eight) hours as needed for nausea or vomiting. 12/01/20   Neldon Hamp RAMAN, PA  pravastatin  (PRAVACHOL ) 40 MG tablet Take 40 mg by mouth daily.    [provider]  sitaGLIPtin (JANUVIA) 50 MG tablet Take 50 mg by mouth daily.    [provider]    Allergies: Fish allergy, Ivp dye [iodinated contrast media], and Xalatan [latanoprost]    Review of Systems  All other systems reviewed and are negative.   Updated Vital Signs BP 137/64 (BP Location: Right Arm)   Pulse 72   Temp 98.5 F (36.9 C)   Resp 16   Ht 1.753 m (5' 9)   Wt 98.9 kg   LMP 09/07/2010   SpO2 100%   BMI 32.19 kg/m   Physical Exam Vitals and nursing note reviewed.  Constitutional:      General: She is not in acute distress.    Appearance: Normal appearance. She is well-developed. She is not toxic-appearing.  HENT:     Head: Normocephalic and atraumatic.  Eyes:     General: Lids are normal.     Conjunctiva/sclera: Conjunctivae normal.     Pupils: Pupils are  equal, round, and reactive to light.  Neck:     Thyroid: No thyroid mass.     Trachea: No tracheal deviation.  Cardiovascular:     Rate and Rhythm: Normal rate and regular rhythm.     Heart sounds: Normal heart sounds. No murmur heard.    No gallop.  Pulmonary:     Effort: Pulmonary effort is normal. No respiratory distress.     Breath sounds: Normal breath sounds. No stridor. No decreased breath sounds, wheezing, rhonchi or rales.  Abdominal:     General: There is no distension.     Palpations: Abdomen is soft.     Tenderness: There is no abdominal tenderness. There is no rebound.  Musculoskeletal:        General: No tenderness. Normal range of motion.     Cervical back: Normal range of motion and neck supple.  Skin:     General: Skin is warm and dry.     Findings: No abrasion or rash.  Neurological:     General: No focal deficit present.     Mental Status: She is alert and oriented to person, place, and time. Mental status is at baseline.     GCS: GCS eye subscore is 4. GCS verbal subscore is 5. GCS motor subscore is 6.     Cranial Nerves: No cranial nerve deficit.     Sensory: No sensory deficit.     Motor: Motor function is intact.     Gait: Gait is intact.     Comments: Strength is 5 of 5 in upper as well as lower extremities  Psychiatric:        Attention and Perception: Attention normal.        Speech: Speech normal.        Behavior: Behavior normal.     (all labs ordered are listed, but only abnormal results are displayed) Labs Reviewed  BASIC METABOLIC PANEL WITH GFR - Abnormal; Notable for the following components:      Result Value   Glucose, Bld 142 (*)    Creatinine, Ser 1.14 (*)    GFR, Estimated 56 (*)    All other components within normal limits  CBC WITH DIFFERENTIAL/PLATELET    EKG: EKG Interpretation Date/Time:  Friday February 29 2024 13:46:15 EST Ventricular Rate:  87 PR Interval:  156 QRS Duration:  84 QT Interval:  366 QTC Calculation: 440 R Axis:   22  Text Interpretation: Normal sinus rhythm Low voltage QRS Cannot rule out Anterior infarct , age undetermined Abnormal ECG When compared with ECG of 27-Feb-2024 13:55, PREVIOUS ECG IS PRESENT No significant change since last tracing Confirmed by Dasie Faden ([id number]) on 02/29/2024 6:43:01 PM  Radiology: MR BRAIN WO CONTRAST Result Date: 02/29/2024 EXAM: MRI BRAIN WITHOUT CONTRAST 02/29/2024 03:36:26 PM TECHNIQUE: Multiplanar multisequence MRI of the head/brain was performed without the administration of intravenous contrast. COMPARISON: CTA head and neck 02/27/2024 and MRI head 08/26/2023. CLINICAL HISTORY: Transient ischemic attack (TIA); left arm tingling, resolved, h/o TIA. FINDINGS: BRAIN AND VENTRICLES: No acute  infarct. No intracranial hemorrhage. No mass. No midline shift. No hydrocephalus. Similar appearance of mild chronic microvascular ischemic changes. Cerebral volume within normal limits for patient's age. The sella is unremarkable. Normal flow voids. ORBITS: Single abnormality in the right globe again noted likely related to silicone oil injection. SINUSES AND MASTOIDS: Mild mucosal thickening in the maxillary sinuses. No acute abnormality in the mastoids. BONES AND SOFT TISSUES: Normal marrow signal. No acute soft tissue abnormality.  IMPRESSION: 1. No acute intracranial abnormality. 2. Mild chronic microvascular ischemic changes, similar to prior studies. Electronically signed by: Donnice Mania MD 02/29/2024 06:23 PM EST RP Workstation: [id number]     Procedures   Medications Ordered in the ED - No data to display                                  Medical Decision Making  Patient is MRI of the brain today showed no acute findings.  Labs are reassuring.  Case discussed with Dr. Voncile From neurology who reviewed the patient's scans today.  He recommends that patient continue her aspirin  as well as start taking her Plavix .  Recommends that she follow-up with neurology within the next 3 weeks.  I will give patient referral.  Patient comfortable with these recommendations     Final diagnoses:  None    ED Discharge Orders     None          Dasie Faden, MD 02/29/24 1911

## 2024-02-29 NOTE — Discharge Instructions (Addendum)
 Take the Plavix  as prescribed.  Call the neurologist to schedule a follow-up appointment.  Return here for any problems

## 2024-02-29 NOTE — ED Provider Triage Note (Signed)
 Emergency Medicine Provider Triage Evaluation Note  Jill Foley , a 58 y.o. female  was evaluated in triage.  Pt complains of left upper extremity tingling, occurring around 12:30 PM today and lasting for about 10 to 15 minutes.  Patient was seen a couple of days ago at freestanding ED.  She had neurology consult.  They recommended dual antiplatelet therapy.  Comfortable with outpatient workup at that time given resolution of symptoms.  She had a CTA of the head and neck.  Patient states she was told that if symptoms recurred, to call the ambulance and she did today.  No associated weakness, speech difficulties, facial droop, imbalance or difficulty walking.  Patient just picked up her Plavix  today and has not yet taken the first dose.  Review of Systems  Positive: Paresthesias Negative: Weakness  Physical Exam  BP (!) 160/110 (BP Location: Right Arm)   Pulse 75   Temp 98.8 F (37.1 C) (Oral)   Resp 18   Ht 5' 9 (1.753 m)   Wt 98.9 kg   LMP 09/07/2010   SpO2 100%   BMI 32.19 kg/m  Gen:   Awake, no distress   Resp:  Normal effort  MSK:   Moves extremities without difficulty  Other:  No gross upper or lower extremity weakness  Medical Decision Making  Medically screening exam initiated at 2:15 PM.  Appropriate orders placed.  Jill Foley was informed that the remainder of the evaluation will be completed by another provider, this initial triage assessment does not replace that evaluation, and the importance of remaining in the ED until their evaluation is complete.  Will go ahead and obtain an MRI of the brain today.  If there is a sign of any stroke, would need admission to the hospital.  If negative and recurrent TIA, then decision will need to be made for admission versus continued outpatient plan, initiate Plavix  therapy as previously prescribed.   Jill Chew, PA-C 02/29/24 1418

## 2024-02-29 NOTE — ED Triage Notes (Addendum)
 Per EMS, Pt, from home, c/o L arm tingling starting around 1240 which resolved prior to arrival.  Pt had tingling in L arm and L leg x2 days ago and was seen at [facility].  Pt was prescribed Plavix  but has not started taking it.  Pt currently has no complaints.      Denies weakness.

## 2024-03-01 ENCOUNTER — Encounter (HOSPITAL_BASED_OUTPATIENT_CLINIC_OR_DEPARTMENT_OTHER): Payer: Self-pay | Admitting: Emergency Medicine

## 2024-03-01 ENCOUNTER — Other Ambulatory Visit: Payer: Self-pay

## 2024-03-01 ENCOUNTER — Emergency Department (HOSPITAL_BASED_OUTPATIENT_CLINIC_OR_DEPARTMENT_OTHER)
Admission: EM | Admit: 2024-03-01 | Discharge: 2024-03-01 | Disposition: A | Discharge status: Home / Self Care | Attending: Emergency Medicine | Admitting: Emergency Medicine

## 2024-03-01 DIAGNOSIS — Z7902 Long term (current) use of antithrombotics/antiplatelets: Secondary | ICD-10-CM | POA: Diagnosis not present

## 2024-03-01 DIAGNOSIS — F419 Anxiety disorder, unspecified: Secondary | ICD-10-CM | POA: Insufficient documentation

## 2024-03-01 DIAGNOSIS — E119 Type 2 diabetes mellitus without complications: Secondary | ICD-10-CM | POA: Insufficient documentation

## 2024-03-01 DIAGNOSIS — Z7982 Long term (current) use of aspirin: Secondary | ICD-10-CM | POA: Diagnosis not present

## 2024-03-01 NOTE — ED Notes (Signed)
 Patient transferred from waiting room to ED treatment room. Assuming pt care at this time.

## 2024-03-01 NOTE — ED Triage Notes (Signed)
 Pt to ED via [facility] with c/o feeling jittery, like I can't calm down; pt calm and cooperative at this time; seen twice recently for paresthesias/TIA-like sxs; normal MRI brain

## 2024-03-01 NOTE — Discharge Instructions (Signed)
 Continue medications as previously prescribed.  Return to the ER if you develop any new and/or concerning issues.

## 2024-03-01 NOTE — ED Notes (Signed)
 Pt reports a few minutes of jitteriness at home PTA. Pt states that she felt as though she couldn't calm down in that moment. Pt denies symptoms at this time. Pt is calm and cooperative. Pt denies anxiety at this time.

## 2024-03-01 NOTE — ED Provider Notes (Signed)
  EMERGENCY DEPARTMENT AT [facility] [place] Provider Note   CSN: [id number] Arrival date & time: 03/01/24  2206     Patient presents with: Anxiety   Jill Foley is a 58 y.o. female.   Patient is a 58 year old female with past medical history of achalasia, type 2 diabetes, possible TIA, chronic renal insufficiency.  Patient presenting today for feelings of jitteriness and anxiety.  She was cooking dinner this evening when she began feeling stressed.  She began with jitteriness, so called 911.  Patient states that her symptoms have since resolved.  She was seen in the ER on Wednesday, then again yesterday and has undergone MRI of the brain, CTA of the head and neck, and laboratory studies, all of which are unremarkable.       Prior to Admission medications   Medication Sig Start Date End Date Taking? Authorizing Provider  aspirin  81 MG chewable tablet Chew 1 tablet (81 mg total) by mouth daily. 02/27/24   Neldon Hamp RAMAN, PA  BIOTIN PO Take 10,000 Units by mouth daily.     [provider]  Calcium Carb-Cholecalciferol 600-800 MG-UNIT TABS Take 1 tablet by mouth daily.    [provider]  cholecalciferol (VITAMIN D3) 25 MCG (1000 UT) tablet Take 2,000 Units by mouth daily.    [provider]  clopidogrel  (PLAVIX ) 75 MG tablet Take 1 tablet (75 mg total) by mouth daily. 02/27/24   Neldon Hamp RAMAN, PA  ferrous sulfate 325 (65 FE) MG tablet Take by mouth.    [provider]  ketotifen (ALAWAY) 0.025 % ophthalmic solution Apply to eye. 05/04/15   [provider]  lisinopril-hydrochlorothiazide (PRINZIDE,ZESTORETIC) 20-12.5 MG per tablet Take 1 tablet by mouth daily.    [provider]  Magnesium Oxide 250 MG TABS Take 1 tablet by mouth daily.    [provider]  methylcellulose (ARTIFICIAL TEARS) 1 % ophthalmic solution Place 1 drop into both eyes 2 (two) times daily.    [provider]  Multiple  Vitamin (MULTIVITAMIN) capsule Take 1 capsule by mouth daily.    [provider]  ondansetron  (ZOFRAN  ODT) 4 MG disintegrating tablet Take 1 tablet (4 mg total) by mouth every 8 (eight) hours as needed for nausea or vomiting. 12/01/20   Neldon Hamp RAMAN, PA  pravastatin  (PRAVACHOL ) 40 MG tablet Take 40 mg by mouth daily.    [provider]  sitaGLIPtin (JANUVIA) 50 MG tablet Take 50 mg by mouth daily.    [provider]    Allergies: Fish allergy, Ivp dye [iodinated contrast media], and Xalatan [latanoprost]    Review of Systems  All other systems reviewed and are negative.   Updated Vital Signs BP (!) 155/67 (BP Location: Left Arm)   Pulse 92   Temp 98.4 F (36.9 C) (Oral)   Resp 14   Ht 5' 9 (1.753 m)   Wt 96.2 kg   LMP 09/07/2010   SpO2 100%   BMI 31.31 kg/m   Physical Exam Vitals and nursing note reviewed.  Constitutional:      General: She is not in acute distress.    Appearance: She is well-developed. She is not diaphoretic.  HENT:     Head: Normocephalic and atraumatic.  Cardiovascular:     Rate and Rhythm: Normal rate and regular rhythm.     Heart sounds: No murmur heard.    No friction rub. No gallop.  Pulmonary:     Effort: Pulmonary effort is normal.  No respiratory distress.     Breath sounds: Normal breath sounds. No wheezing.  Abdominal:     General: Bowel sounds are normal. There is no distension.     Palpations: Abdomen is soft.     Tenderness: There is no abdominal tenderness.  Musculoskeletal:        General: Normal range of motion.     Cervical back: Normal range of motion and neck supple.  Skin:    General: Skin is warm and dry.  Neurological:     General: No focal deficit present.     Mental Status: She is alert and oriented to person, place, and time.     (all labs ordered are listed, but only abnormal results are displayed) Labs Reviewed - No data to display  EKG: None  Radiology: MR BRAIN WO  CONTRAST Result Date: 02/29/2024 EXAM: MRI BRAIN WITHOUT CONTRAST 02/29/2024 03:36:26 PM TECHNIQUE: Multiplanar multisequence MRI of the head/brain was performed without the administration of intravenous contrast. COMPARISON: CTA head and neck 02/27/2024 and MRI head 08/26/2023. CLINICAL HISTORY: Transient ischemic attack (TIA); left arm tingling, resolved, h/o TIA. FINDINGS: BRAIN AND VENTRICLES: No acute infarct. No intracranial hemorrhage. No mass. No midline shift. No hydrocephalus. Similar appearance of mild chronic microvascular ischemic changes. Cerebral volume within normal limits for patient's age. The sella is unremarkable. Normal flow voids. ORBITS: Single abnormality in the right globe again noted likely related to silicone oil injection. SINUSES AND MASTOIDS: Mild mucosal thickening in the maxillary sinuses. No acute abnormality in the mastoids. BONES AND SOFT TISSUES: Normal marrow signal. No acute soft tissue abnormality. IMPRESSION: 1. No acute intracranial abnormality. 2. Mild chronic microvascular ischemic changes, similar to prior studies. Electronically signed by: Donnice Mania MD 02/29/2024 06:23 PM EST RP Workstation: [id number]     Procedures   Medications Ordered in the ED - No data to display                                  Medical Decision Making  Patient presenting with feelings of anxiety as described in the HPI.  She arrives here with stable vital signs and is afebrile.  Physical examination is unremarkable and she is neurologically intact.  Upon reviewing her record, she has undergone multiple studies over the past few days for unrelated complaints.  She is neurologically intact and shows no focal deficits.  I do not feel as though any further workup is indicated at this time.  Patient tells me her symptoms are resolved and she is back to baseline.  Suspect a component of anxiety.  Highly doubt CVA/TIA.  Patient to be discharged with as needed return.     Final  diagnoses:  None    ED Discharge Orders     None          Geroldine Berg, MD 03/01/24 2316

## 2024-03-10 ENCOUNTER — Inpatient Hospital Stay: Admitting: Neurology

## 2024-03-19 DIAGNOSIS — D72810 Lymphocytopenia: Secondary | ICD-10-CM | POA: Insufficient documentation

## 2024-03-20 ENCOUNTER — Emergency Department (HOSPITAL_COMMUNITY)
Admission: EM | Admit: 2024-03-20 | Discharge: 2024-03-21 | Disposition: A | Attending: Emergency Medicine | Admitting: Emergency Medicine

## 2024-03-20 ENCOUNTER — Emergency Department (HOSPITAL_COMMUNITY)

## 2024-03-20 DIAGNOSIS — D72819 Decreased white blood cell count, unspecified: Secondary | ICD-10-CM | POA: Insufficient documentation

## 2024-03-20 DIAGNOSIS — I6789 Other cerebrovascular disease: Secondary | ICD-10-CM | POA: Diagnosis not present

## 2024-03-20 DIAGNOSIS — Z79899 Other long term (current) drug therapy: Secondary | ICD-10-CM | POA: Insufficient documentation

## 2024-03-20 DIAGNOSIS — Z8673 Personal history of transient ischemic attack (TIA), and cerebral infarction without residual deficits: Secondary | ICD-10-CM | POA: Insufficient documentation

## 2024-03-20 DIAGNOSIS — H543 Unqualified visual loss, both eyes: Secondary | ICD-10-CM | POA: Insufficient documentation

## 2024-03-20 DIAGNOSIS — Z7902 Long term (current) use of antithrombotics/antiplatelets: Secondary | ICD-10-CM | POA: Insufficient documentation

## 2024-03-20 DIAGNOSIS — E119 Type 2 diabetes mellitus without complications: Secondary | ICD-10-CM | POA: Diagnosis not present

## 2024-03-20 DIAGNOSIS — E876 Hypokalemia: Secondary | ICD-10-CM | POA: Insufficient documentation

## 2024-03-20 DIAGNOSIS — R402412 Glasgow coma scale score 13-15, at arrival to emergency department: Secondary | ICD-10-CM | POA: Diagnosis present

## 2024-03-20 DIAGNOSIS — R202 Paresthesia of skin: Secondary | ICD-10-CM | POA: Insufficient documentation

## 2024-03-20 DIAGNOSIS — Z7901 Long term (current) use of anticoagulants: Secondary | ICD-10-CM | POA: Insufficient documentation

## 2024-03-20 DIAGNOSIS — Z7982 Long term (current) use of aspirin: Secondary | ICD-10-CM | POA: Diagnosis not present

## 2024-03-20 LAB — CBC
HCT: 41.4 % (ref 36.0–46.0)
Hemoglobin: 13.1 g/dL (ref 12.0–15.0)
MCH: 27 pg (ref 26.0–34.0)
MCHC: 31.6 g/dL (ref 30.0–36.0)
MCV: 85.2 fL (ref 80.0–100.0)
Platelets: 268 K/uL (ref 150–400)
RBC: 4.86 MIL/uL (ref 3.87–5.11)
RDW: 13.9 % (ref 11.5–15.5)
WBC: 3.5 K/uL — ABNORMAL LOW (ref 4.0–10.5)
nRBC: 0 % (ref 0.0–0.2)

## 2024-03-20 LAB — COMPREHENSIVE METABOLIC PANEL WITH GFR
ALT: 16 U/L (ref 0–44)
AST: 30 U/L (ref 15–41)
Albumin: 4.2 g/dL (ref 3.5–5.0)
Alkaline Phosphatase: 76 U/L (ref 38–126)
Anion gap: 13 (ref 5–15)
BUN: 13 mg/dL (ref 6–20)
CO2: 26 mmol/L (ref 22–32)
Calcium: 9.8 mg/dL (ref 8.9–10.3)
Chloride: 102 mmol/L (ref 98–111)
Creatinine, Ser: 1 mg/dL (ref 0.44–1.00)
GFR, Estimated: >60 mL/min (ref >60)
Glucose, Bld: 181 mg/dL — ABNORMAL HIGH (ref 70–99)
Potassium: 3.1 mmol/L — ABNORMAL LOW (ref 3.5–5.1)
Sodium: 141 mmol/L (ref 135–145)
Total Bilirubin: 0.4 mg/dL (ref 0.0–1.2)
Total Protein: 7.3 g/dL (ref 6.5–8.1)

## 2024-03-20 LAB — APTT: aPTT: 26 s (ref 24–36)

## 2024-03-20 LAB — DIFFERENTIAL
Abs Immature Granulocytes: 0.02 K/uL (ref 0.00–0.07)
Basophils Absolute: 0 K/uL (ref 0.0–0.1)
Basophils Relative: 1 %
Eosinophils Absolute: 0.2 K/uL (ref 0.0–0.5)
Eosinophils Relative: 4 %
Immature Granulocytes: 1 %
Lymphocytes Relative: 19 %
Lymphs Abs: 0.7 K/uL (ref 0.7–4.0)
Monocytes Absolute: 0.4 K/uL (ref 0.1–1.0)
Monocytes Relative: 13 %
Neutro Abs: 2.2 K/uL (ref 1.7–7.7)
Neutrophils Relative %: 62 %

## 2024-03-20 LAB — PROTIME-INR
INR: 1 (ref 0.8–1.2)
Prothrombin Time: 13.4 s (ref 11.4–15.2)

## 2024-03-20 LAB — CBG MONITORING, ED: Glucose-Capillary: 161 mg/dL — ABNORMAL HIGH (ref 70–99)

## 2024-03-20 LAB — ETHANOL: Alcohol, Ethyl (B): <15 mg/dL (ref <15)

## 2024-03-20 NOTE — ED Provider Triage Note (Signed)
 Emergency Medicine Provider Triage Evaluation Note  Tekeisha Hakim , a 58 y.o. 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  Pt complains of L sided paresthesias that started between 2-3 pm.  Denies any vision changes, weakness, facial droop, difficulty speaking.  Review of Systems  Positive: See above Negative: Headache, weakness, neck pain, back pain  Physical Exam  BP (!) 152/67 (BP Location: Left Arm)   Pulse 98   Temp 97.9 F (36.6 C) (Oral)   Resp 16   Ht 5' 9 (1.753 m)   Wt 96.2 kg   LMP 09/07/2010   SpO2 100%   BMI 31.31 kg/m  Gen:   Awake, no distress   Resp:  Normal effort  MSK:   Moves extremities without difficulty  Other:    NIHSS Exam  Level of Consciousness: Alert  LOC Questions: Answers Month and Age Correctly  LOC Commands: Opens and Closes Eyes and Hands on command  Best Gaze: Horizontal ocular movements intact  Visual Fields: Vision loss out of both eyes except for central vision in the left eye that is at her baseline Facial Palsy: None  L Upper Extremity Motor: No drift after 10 seconds  R Upper Extremity Motor: No drift after 10 seconds  L Lower extremity Motor: No drift after 5 seconds  R Lower extremity Motor: No drift after 5 seconds  Ataxia: Normal finger-to-nose bilaterally and heel-to-shin on the left side.  Right lower extremity is in a brace and unable to test heel-to-shin on the right side Sensory: Intact sensation to light touch on face, arms, trunk, and legs bilaterally  Best Language: No aphasia  Dysarthria: No dysarthria  Neglect: No visual or sensory neglect    Medical Decision Making  Medically screening exam initiated at 6:48 PM.  Appropriate orders placed.  Sharlena Kristensen was informed that the remainder of the evaluation will be completed by another provider, this initial triage assessment does not replace that evaluation, and the importance of remaining in the ED until their  evaluation is complete  Patient does not have any new neurologic deficits on my exam.  Also appears to be outside of the window for TNK if her last known well was 2 PM.  She is Fleeta negative. Do not feel that code stroke is warranted at this time.  Will obtain lab work and give her to her room for complete evaluation   Yolande Lamar BROCKS, MD 03/20/24 [phone]

## 2024-03-20 NOTE — ED Triage Notes (Signed)
 Pt arrives via [facility] c/o paresthesias to her LUE and LLE. Pt states this started between 1500 and 1600 today. Pt unable to give exact LKW, but states that she felt normal between 1400-1500 today. Pt without unilateral weakness during triage.

## 2024-03-21 NOTE — Discharge Instructions (Signed)
 You were seen in the ER tonight with tingling experienced in her left side.  Fortunately your workup tonight was very reassuring.  You were already optimized on the medications you are taking with aspirin  and Plavix .  Please follow-up in the outpatient setting with your neurologist as discussed and return to the ER with any severe symptoms.

## 2024-03-21 NOTE — ED Provider Notes (Signed)
 " [facility] Provider Note   CSN: [id number] Arrival date & time: 03/20/24  [id number]     Patient presents with: Numbness and Dizziness   Jill Foley is a 58 y.o. female who presents with concern for transient paresthesias in the left upper and lower extremities.  Patient has history of TIA and is on aspirin  and Plavix , had exact same presentation 1 month ago with reassuring CTA and MRIs at that time and teleneurology evaluation.  States that today she was watching TV and around 3:00 noted sudden onset paresthesias in the left upper and lower extremity without weakness, vision changes, difficulty with speech, or difficulty ambulating.  She states that she had transient lightheadedness that resolved prior to EMS arrival but had persistent tingling for approximately 1 hour before this also spontaneously resolved.  She endorses compliance with her medications and has an established outpatient neurologist.  Unfortunately patient was post to be seen in the office this past week when it snowed, causing her to miss her appointment.  She states she has been rebooked for March 2026.  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  She reports being back to her baseline at this time.    HPI     Prior to Admission medications  Medication Sig Start Date End Date Taking? Authorizing Provider  aspirin  81 MG chewable tablet Chew 1 tablet (81 mg total) by mouth daily. 02/27/24   Neldon Hamp RAMAN, PA  BIOTIN PO Take 10,000 Units by mouth daily.     [provider]  Calcium Carb-Cholecalciferol 600-800 MG-UNIT TABS Take 1 tablet by mouth daily.    [provider]  cholecalciferol (VITAMIN D3) 25 MCG (1000 UT) tablet Take 2,000 Units by mouth daily.    [provider]  clopidogrel  (PLAVIX ) 75 MG tablet Take 1 tablet (75 mg  total) by mouth daily. 02/27/24   Neldon Hamp RAMAN, PA  ferrous sulfate 325 (65 FE) MG tablet Take by mouth.    [provider]  ketotifen (ALAWAY) 0.025 % ophthalmic solution Apply to eye. 05/04/15   [provider]  lisinopril-hydrochlorothiazide (PRINZIDE,ZESTORETIC) 20-12.5 MG per tablet Take 1 tablet by mouth daily.    [provider]  Magnesium Oxide 250 MG TABS Take 1 tablet by mouth daily.    [provider]  methylcellulose (ARTIFICIAL TEARS) 1 % ophthalmic solution Place 1 drop into both eyes 2 (two) times daily.    [provider]  Multiple Vitamin (MULTIVITAMIN) capsule Take 1 capsule by mouth daily.    [provider]  ondansetron  (ZOFRAN  ODT) 4 MG disintegrating tablet Take 1 tablet (4 mg total) by mouth every 8 (eight) hours as needed for nausea or vomiting. 12/01/20   Neldon Hamp RAMAN, PA  pravastatin  (PRAVACHOL ) 40 MG tablet Take 40 mg by mouth daily.    [provider]  sitaGLIPtin (JANUVIA) 50 MG tablet Take 50 mg by mouth daily.    [provider]    Allergies: Fish allergy, Ivp dye [iodinated contrast media], and Xalatan [latanoprost]    Review of Systems  Constitutional: Negative.   HENT: Negative.    Respiratory: Negative.    Cardiovascular: Negative.   Gastrointestinal: Negative.   Neurological:  Positive for light-headedness. Negative for numbness.       Parasthesias.     Updated Vital Signs BP 130/81  Pulse 84   Temp 99.2 F (37.3 C) (Oral)   Resp 16   Ht 5' 9 (1.753 m)   Wt 96.2 kg   LMP 09/07/2010   SpO2 100%   BMI 31.31 kg/m   Physical Exam Vitals and nursing note reviewed.  Constitutional:      Appearance: She is not ill-appearing or toxic-appearing.  HENT:     Head: Normocephalic and atraumatic.     Mouth/Throat:     Mouth: Mucous membranes are moist.     Pharynx: No oropharyngeal exudate or posterior oropharyngeal erythema.  Eyes:     General: Lids are normal. Vision  grossly intact. No scleral icterus.       Right eye: No discharge.        Left eye: No discharge.     Extraocular Movements: Extraocular movements intact.     Conjunctiva/sclera: Conjunctivae normal.     Comments: Vision at patient's baseline.  Cardiovascular:     Rate and Rhythm: Normal rate and regular rhythm.     Pulses: Normal pulses.     Heart sounds: Normal heart sounds. No murmur heard. Pulmonary:     Effort: Pulmonary effort is normal. No respiratory distress.     Breath sounds: Normal breath sounds. No wheezing or rales.  Abdominal:     General: Bowel sounds are normal. There is no distension.     Palpations: Abdomen is soft.     Tenderness: There is no abdominal tenderness.  Musculoskeletal:        General: No deformity.     Cervical back: Neck supple.  Skin:    General: Skin is warm and dry.     Capillary Refill: Capillary refill takes less than 2 seconds.  Neurological:     General: No focal deficit present.     Mental Status: She is alert and oriented to person, place, and time. Mental status is at baseline.     GCS: GCS eye subscore is 4. GCS verbal subscore is 5. GCS motor subscore is 6.     Cranial Nerves: Cranial nerves 2-12 are intact.     Sensory: Sensation is intact.     Motor: Motor function is intact.     Coordination: Coordination is intact.     Gait: Gait is intact.  Psychiatric:        Mood and Affect: Mood normal.     (all labs ordered are listed, but only abnormal results are displayed) Labs Reviewed  CBC - Abnormal; Notable for the following components:      Result Value   WBC 3.5 (*)    All other components within normal limits  COMPREHENSIVE METABOLIC PANEL WITH GFR - Abnormal; Notable for the following components:   Potassium 3.1 (*)    Glucose, Bld 181 (*)    All other components within normal limits  CBG MONITORING, ED - Abnormal; Notable for the following components:   Glucose-Capillary 161 (*)    All other components within normal  limits  PROTIME-INR  APTT  DIFFERENTIAL  ETHANOL  URINE DRUG SCREEN  CBG MONITORING, ED    EKG: EKG Interpretation Date/Time:  Thursday March 20 2024 19:15:55 EST Ventricular Rate:  87 PR Interval:  170 QRS Duration:  82 QT Interval:  354 QTC Calculation: 425 R Axis:   14  Text Interpretation: Normal sinus rhythm Low voltage QRS Cannot rule out Anterior infarct , age undetermined Abnormal ECG When compared with ECG of 29-Feb-2024 13:46, PREVIOUS ECG IS PRESENT Confirmed by  Theadore Sharper ([id number]) on 03/21/2024 3:20:16 AM  Radiology: MR BRAIN WO CONTRAST Result Date: 03/20/2024 CLINICAL DATA:  Initial evaluation for acute neuro deficit, stroke suspected. EXAM: MRI HEAD WITHOUT CONTRAST TECHNIQUE: Multiplanar, multiecho pulse sequences of the brain and surrounding structures were obtained without intravenous contrast. COMPARISON:  CT from earlier the same day as well as previous exams. FINDINGS: Brain: Cerebral volume within normal limits. Changes of chronic microvascular ischemic disease, mild in nature, stable. No acute or subacute infarct. No areas of chronic cortical infarction. No acute or chronic intracranial blood products. No mass lesion, midline shift or mass effect. No hydrocephalus or extra-axial fluid collection. Pituitary gland within normal limits. Vascular: Major intracranial vascular flow voids are maintained. Skull and upper cervical spine: Craniocervical junction within normal limits. Bone marrow signal intensity overall within normal limits. No scalp soft tissue abnormality. Sinuses/Orbits: Prior bilateral ocular lens replacement with additional postoperative changes at the right globe. Paranasal sinuses are largely clear. No significant mastoid effusion. Other: None. IMPRESSION: 1. Stable brain MRI.  No acute intracranial abnormality. 2. Mild chronic microvascular ischemic disease. Electronically Signed   By: Morene Hoard M.D.   On: 03/20/2024 21:09   CT HEAD WO  CONTRAST Result Date: 03/20/2024 EXAM: CT HEAD WITHOUT CONTRAST 03/20/2024 07:34:00 PM TECHNIQUE: CT of the head was performed without the administration of intravenous contrast. Automated exposure control, iterative reconstruction, and/or weight based adjustment of the mA/kV was utilized to reduce the radiation dose to as low as reasonably achievable. COMPARISON: 02/27/2024 CLINICAL HISTORY: Right eye visual difficulties and left sided paresthesias. FINDINGS: BRAIN AND VENTRICLES: No acute hemorrhage. No evidence of acute infarct. No extra-axial collection. No mass effect or midline shift. ORBITS: Hyperdensity is again noted within the right globe. SINUSES: No acute abnormality. SOFT TISSUES AND SKULL: No acute soft tissue abnormality. No skull fracture. IMPRESSION: 1. No acute intracranial abnormality. 2. Stable Hyperdensity within the right globe. Electronically signed by: Oneil Devonshire MD 03/20/2024 07:39 PM EST RP Workstation: [id number]     Procedures   Medications Ordered in the ED - No data to display                                  Medical Decision Making 58 year old female presents with concern for paresthesias to left upper and lower extremity that are transient.  History of TIA.  Hypertensive on intake vital signs normal.  Cardiopulmonary Sam normal, abdominal exam is benign.  Neurologic exam nonfocal at this time.  DDx includes but is not limited to spinal radiculopathy, demyelinating disease, CVA, TIA.   Amount and/or Complexity of Data Reviewed Labs:     Details: CBC without leukocytosis, CMP with mild hypokalemia of 3.1.  INR is normal, EtOH is negative, Radiology:     Details:  CT head and MRI brain were both done prior to my evaluation of the patient and were without acute intracranial abnormalities.     Case discussed with Dr. Vanessa, neurologist, who after reviewing the patient's records feels that given patient's symptoms are stable with multiple visits for  similar in the past, each with reassuring workup.  Given patient is already optimized on aspirin  and Plavix  and has returned to baseline, no further intervention would be offered at this time.  He feels she is appropriate for outpatient follow-up.  I appreciate his collaboration care of this patient.  Clinical concern for emergent underlying condition that would warrant further ED workup or  inpatient management is exceedingly low.  Marysue voiced understanding of her medical evaluation and treatment plan. Each of their questions answered to their expressed satisfaction.  Return precautions were given.  Patient is well-appearing, stable, and was discharged in good condition.  This chart was dictated using voice recognition software, Dragon. Despite the best efforts of this provider to proofread and correct errors, errors may still occur which can change documentation meaning.      Final diagnoses:  Paresthesia    ED Discharge Orders          Ordered    Ambulatory referral to Neurology       Comments: An appointment is requested in approximately: 2 weeks   03/21/24 0403               Daliah Chaudoin, Pleasant SAUNDERS, PA-C 03/21/24 [id number]    Theadore Ozell HERO, MD 03/21/24 [phone]  "

## 2024-03-28 NOTE — Progress Notes (Signed)
 System confirms that patient read results and comments online

## 2024-04-08 ENCOUNTER — Encounter: Payer: Self-pay | Admitting: Neurology

## 2024-04-08 ENCOUNTER — Ambulatory Visit (INDEPENDENT_AMBULATORY_CARE_PROVIDER_SITE_OTHER): Admitting: Neurology

## 2024-04-08 VITALS — BP 145/68 | HR 92 | Ht 70.0 in | Wt 224.0 lb

## 2024-04-08 DIAGNOSIS — R202 Paresthesia of skin: Secondary | ICD-10-CM | POA: Diagnosis not present

## 2024-04-08 DIAGNOSIS — Z8673 Personal history of transient ischemic attack (TIA), and cerebral infarction without residual deficits: Secondary | ICD-10-CM | POA: Diagnosis not present

## 2024-04-08 DIAGNOSIS — I6522 Occlusion and stenosis of left carotid artery: Secondary | ICD-10-CM

## 2024-04-08 NOTE — Patient Instructions (Signed)
 I had a long discussion with the patient regards to her left arm and leg paresthesia and discussed differential diagnosis and results of imaging studies questions.  I recommend further evaluation checking MRI scan cervical spine to rule out any compressive etiology.  Continue aspirin  81 mg daily for stroke prevention and maintain aggressive risk factor modification with strict control of hypertension with blood pressure goal below 130/90, lipids with LDL cholesterol goal below 70 mg percent and diabetes with hemoglobin A1c goal below 6.5%.  I encouraged her to eat a healthy diet with lots of fruits, vegetables, cereals, whole grains and to be active and exercise regularly.  Return for follow-up in the future in 6 to 8 months or call earlier if necessary.

## 2024-04-08 NOTE — Progress Notes (Addendum)
 " [facility] [address] [place]. [place] [address] [phone]       OFFICE CONSULT NOTE  Jill Foley Date of Birth:  Aug 03, 1965 Medical Record Number:  [id number]   Referring MD:  Hamp Bow, PA-C  Reason for Referral: TIA  HPI: Jill Foley is a pleasant 59 year old lady seen today for initial office consultation for episode of transient numbness.  History is obtained from the patient and review of electronic medical records.  I have personally reviewed pertinent available imaging films in PACS.  Patient has past medical history of diabetes, hypertension, hyperlipidemia and prior TIA who presented on 02/27/2024 to the emergency room for evaluation for sudden onset of left-sided numbness.  Patient symptoms began the night prior and lasted about 6 hours and resolved by the time she was seen in the ER.  In May 2025 she had an episode of tightness in the left eye but no loss of vision.  MRI scan of the brain at that time was unremarkable.  MRI on 03/20/2024 showed no acute abnormality.  Mild changes of small vessel disease.  CT angiogram of the brain and neck in 02/27/2024 had shown 65% left common carotid artery stenosis.  Lab work on 03/18/2024 showed hemoglobin A1c 7.4 and LDL cholesterol 54 mg percent.  Patient was started on dual antiplatelet therapy for 3 weeks followed by aspirin  alone which she is tolerating well with minor bruising and no bleeding.  She denies any neck pain, neck injury, radicular pain or numbness.  No difficulty with gait balance walking.  She has not had any cervical spine imaging studies. ROS:   14 system review of systems is positive for numbness vision difficulties, bruising and all other systems negative  PMH:  Past Medical History:  Diagnosis Date   Achalasia    Anemia    Diabetes mellitus    Hypercholesteremia    Hypertension    Legally blind    Seasonal allergies    Sinus infection    Stroke (HCC) 2013   TIA     Social History:  Social History   Socioeconomic History   Marital status: Single    Spouse name: Not on file   Number of children: 0   Years of education: BS   Highest education level: Not on file  Occupational History   Occupation: retired  Tobacco Use   Smoking status: Never   Smokeless tobacco: Never  Substance and Sexual Activity   Alcohol use: No    Alcohol/week: 0.0 standard drinks of alcohol   Drug use: No   Sexual activity: Not Currently    Birth control/protection: Post-menopausal  Other Topics Concern   Not on file  Social History Narrative   Patient lives at home alone.   Caffeine Use: occasionally   Social Drivers of Health   Tobacco Use: Low Risk (04/08/2024)   Patient History    Smoking Tobacco Use: Never    Smokeless Tobacco Use: Never    Passive Exposure: Not on file  Financial Resource Strain: Not on file  Food Insecurity: Low Risk (03/15/2024)   Received from [facility]   Epic    Within the past 12 months, you worried that your food would run out before you got money to buy more: Never true    Within the past 12 months, the food you bought just didn't last and you didn't have money to get more. : Never true  Transportation Needs: No Transportation Needs (03/15/2024)   Received  from [facility]    In the past 12 months, has lack of reliable transportation kept you from medical appointments, meetings, work or from getting things needed for daily living? : No  Physical Activity: Not on file  Stress: Not on file  Social Connections: Not on file  Intimate Partner Violence: Not on file  Depression (EYV7-0): Not on file  Alcohol Screen: Not on file  Housing: Low Risk (03/15/2024)   Received from [facility]   Epic    What is your living situation today?: I have a steady place to live    Think about the place you live. Do you have problems with any of the following? Choose all that apply:: None/None on this list  Utilities:  Low Risk (03/15/2024)   Received from [facility]   Utilities    In the past 12 months has the electric, gas, oil, or water company threatened to shut off services in your home? : No  Health Literacy: Not on file    Medications:  Medications Ordered Prior to Encounter[1]  Allergies:  Allergies[2]  Physical Exam General: well developed, well nourished, seated, in no evident distress Head: head normocephalic and atraumatic.   Neck: supple with no carotid or supraclavicular bruits Cardiovascular: regular rate and rhythm, no murmurs Musculoskeletal: no deformity Skin:  no rash/petichiae Vascular:  Normal pulses all extremities  Neurologic Exam Mental Status: Awake and fully alert. Oriented to place and time. Recent and remote memory intact. Attention span, concentration and fund of knowledge appropriate. Mood and affect appropriate.  Cranial Nerves: Fundoscopic exam not done.. Pupils unequal, sluggishly y reactive to light.  Blind in the right eye.  Decreased left eye nasal field of vision.  Slight esotropia of the left eye.  Extraocular movements full without nystagmus. Visual fields full to confrontation. Hearing intact. Facial sensation intact. Face, tongue, palate moves normally and symmetrically.  Motor: Normal bulk and tone. Normal strength in all tested extremity muscles. Sensory.: intact to touch , pinprick , position and vibratory sensation.  Coordination: Rapid alternating movements normal in all extremities. Finger-to-nose and heel-to-shin performed accurately bilaterally.  Slight diminished vibration and position sense in both feet. Gait and Station: Arises from chair without difficulty. Stance is broad-based gait slow and cautious.  Unable to do tandem walking.   Reflexes: 1+ and symmetric in upper extremities and right knee except both ankle jerks and left knee jerk are absent.. Toes downgoing.   NIHSS  0 Modified Rankin  0     No data to display            ASSESSMENT: 59 year old lady with transient left arm and leg paresthesias of unclear etiology.  Doubt TIA from small vessel disease or neurovascular basis due to absence of facial involvement.  She has mild diabetic neuropathy.     PLAN:I had a long discussion with the patient regards to her left arm and leg paresthesia and discussed differential diagnosis and results of imaging studies questions.  I recommend further evaluation checking MRI scan cervical spine to rule out any compressive etiology.  Continue aspirin  81 mg daily for stroke prevention and maintain aggressive risk factor modification with strict control of hypertension with blood pressure goal below 130/90, lipids with LDL cholesterol goal below 70 mg percent and diabetes with hemoglobin A1c goal below 6.5%.  I encouraged her to eat a healthy diet with lots of fruits, vegetables, cereals, whole grains and to be active and exercise regularly.  Return for follow-up  in the future in 6 to 8 months or call earlier if necessary.   I personally spent a total of 50 minutes in the care of the patient today including getting/reviewing separately obtained history, performing a medically appropriate exam/evaluation, counseling and educating, placing orders, referring and communicating with other health care professionals, documenting clinical information in the EHR, independently interpreting results, and coordinating care.       Eather Popp, MD  Note: This document was prepared with digital dictation and possible smart phrase technology. Any transcriptional errors that result from this process are unintentional.      [1]  Current Outpatient Medications on File Prior to Visit  Medication Sig Dispense Refill   aspirin  81 MG chewable tablet Chew 1 tablet (81 mg total) by mouth daily. 30 tablet 0   atorvastatin (LIPITOR) 40 MG tablet Take 40 mg by mouth daily.     BIOTIN PO Take 10,000 Units by mouth daily.      carboxymethylcellulose  (REFRESH TEARS) 0.5 % SOLN Apply 1-2 drops to eye.     cholecalciferol (VITAMIN D3) 25 MCG (1000 UT) tablet Take 2,000 Units by mouth daily.     clopidogrel  (PLAVIX ) 75 MG tablet Take 1 tablet (75 mg total) by mouth daily. 30 tablet 0   Cod Liver Oil 1250-133 units CAPS Take 1 capsule by mouth.     diphenhydrAMINE -zinc acetate (BENADRYL ) cream Apply 1 Application topically.     famotidine (PEPCID) 20 MG tablet Take 20 mg by mouth.     glipiZIDE (GLUCOTROL XL) 2.5 MG 24 hr tablet Take 2.5 mg by mouth daily.     hydrochlorothiazide (HYDRODIURIL) 12.5 MG tablet Take 12.5 mg by mouth daily.     hydrocortisone 2.5 % cream Apply topically.     ketotifen (ALAWAY) 0.025 % ophthalmic solution Apply to eye.     loratadine (CLARITIN) 10 MG tablet Take 10 mg by mouth.     Magnesium Oxide 250 MG TABS Take 1 tablet by mouth daily.     metFORMIN (GLUCOPHAGE-XR) 500 MG 24 hr tablet Take 1,000 mg by mouth.     methylcellulose (ARTIFICIAL TEARS) 1 % ophthalmic solution Place 1 drop into both eyes 2 (two) times daily.     MIEBO 1.338 GM/ML SOLN Apply 1 drop to eye 4 (four) times daily.     Multiple Vitamin (MULTIVITAMIN) capsule Take 1 capsule by mouth daily.     pioglitazone (ACTOS) 30 MG tablet Take 30 mg by mouth daily.     tizanidine (ZANAFLEX) 2 MG capsule Take 1 pill PO BID prn muscle spasm     TRADJENTA 5 MG TABS tablet Take 5 mg by mouth daily.     XIIDRA 5 % SOLN Apply 1 drop to eye 2 (two) times daily.     Calcium Carb-Cholecalciferol 600-800 MG-UNIT TABS Take 1 tablet by mouth daily. (Patient not taking: Reported on 04/08/2024)     ferrous sulfate 325 (65 FE) MG tablet Take by mouth. (Patient not taking: Reported on 04/08/2024)     lisinopril-hydrochlorothiazide (PRINZIDE,ZESTORETIC) 20-12.5 MG per tablet Take 1 tablet by mouth daily. (Patient not taking: Reported on 04/08/2024)     ondansetron  (ZOFRAN  ODT) 4 MG disintegrating tablet Take 1 tablet (4 mg total) by mouth every 8 (eight) hours as needed for  nausea or vomiting. (Patient not taking: Reported on 04/08/2024) 20 tablet 0   pravastatin  (PRAVACHOL ) 40 MG tablet Take 40 mg by mouth daily. (Patient not taking: Reported on 04/08/2024)     sitaGLIPtin (  JANUVIA) 50 MG tablet Take 50 mg by mouth daily. (Patient not taking: Reported on 04/08/2024)     No current facility-administered medications on file prior to visit.  [2]  Allergies Allergen Reactions   Fish Allergy Itching   Ivp Dye [Iodinated Contrast Media] Itching   Xalatan [Latanoprost]    "

## 2024-04-09 ENCOUNTER — Telehealth: Payer: Self-pay | Admitting: Neurology

## 2024-04-09 NOTE — Telephone Encounter (Signed)
 no auth required sent to [facility] [phone]

## 2024-04-15 ENCOUNTER — Other Ambulatory Visit: Payer: Self-pay | Admitting: Obstetrics and Gynecology

## 2024-04-15 DIAGNOSIS — Z1231 Encounter for screening mammogram for malignant neoplasm of breast: Secondary | ICD-10-CM

## 2024-04-22 ENCOUNTER — Ambulatory Visit
Admission: RE | Admit: 2024-04-22 | Discharge: 2024-04-22 | Disposition: A | Source: Ambulatory Visit | Attending: Neurology | Admitting: Neurology

## 2024-04-22 DIAGNOSIS — R200 Anesthesia of skin: Secondary | ICD-10-CM | POA: Diagnosis not present

## 2024-04-24 ENCOUNTER — Ambulatory Visit
Admission: RE | Admit: 2024-04-24 | Discharge: 2024-04-24 | Disposition: A | Discharge status: Home / Self Care | Source: Ambulatory Visit | Attending: Obstetrics and Gynecology | Admitting: Obstetrics and Gynecology

## 2024-04-24 DIAGNOSIS — Z1231 Encounter for screening mammogram for malignant neoplasm of breast: Secondary | ICD-10-CM

## 2024-04-26 ENCOUNTER — Ambulatory Visit: Payer: Self-pay | Admitting: Neurology

## 2024-05-07 ENCOUNTER — Encounter (HOSPITAL_COMMUNITY): Payer: Self-pay

## 2024-05-07 ENCOUNTER — Emergency Department (HOSPITAL_COMMUNITY)
Admission: EM | Admit: 2024-05-07 | Discharge: 2024-05-07 | Disposition: A | Discharge status: Home / Self Care | Source: Home / Self Care | Attending: Emergency Medicine | Admitting: Emergency Medicine

## 2024-05-07 DIAGNOSIS — H579 Unspecified disorder of eye and adnexa: Secondary | ICD-10-CM

## 2024-05-07 DIAGNOSIS — R202 Paresthesia of skin: Secondary | ICD-10-CM

## 2024-05-07 LAB — BASIC METABOLIC PANEL WITH GFR
Anion gap: 11 (ref 5–15)
BUN: 18 mg/dL (ref 6–20)
CO2: 27 mmol/L (ref 22–32)
Calcium: 9.1 mg/dL (ref 8.9–10.3)
Chloride: 103 mmol/L (ref 98–111)
Creatinine, Ser: 1.07 mg/dL — ABNORMAL HIGH (ref 0.44–1.00)
GFR, Estimated: 60 mL/min — ABNORMAL LOW
Glucose, Bld: 100 mg/dL — ABNORMAL HIGH (ref 70–99)
Potassium: 3.4 mmol/L — ABNORMAL LOW (ref 3.5–5.1)
Sodium: 141 mmol/L (ref 135–145)

## 2024-05-07 LAB — CBC WITH DIFFERENTIAL/PLATELET
Abs Immature Granulocytes: 0.02 10*3/uL (ref 0.00–0.07)
Basophils Absolute: 0 10*3/uL (ref 0.0–0.1)
Basophils Relative: 0 %
Eosinophils Absolute: 0.2 10*3/uL (ref 0.0–0.5)
Eosinophils Relative: 3 %
HCT: 36.6 % (ref 36.0–46.0)
Hemoglobin: 11.6 g/dL — ABNORMAL LOW (ref 12.0–15.0)
Immature Granulocytes: 0 %
Lymphocytes Relative: 19 %
Lymphs Abs: 1 10*3/uL (ref 0.7–4.0)
MCH: 26.9 pg (ref 26.0–34.0)
MCHC: 31.7 g/dL (ref 30.0–36.0)
MCV: 84.9 fL (ref 80.0–100.0)
Monocytes Absolute: 0.6 10*3/uL (ref 0.1–1.0)
Monocytes Relative: 11 %
Neutro Abs: 3.6 10*3/uL (ref 1.7–7.7)
Neutrophils Relative %: 67 %
Platelets: 261 10*3/uL (ref 150–400)
RBC: 4.31 MIL/uL (ref 3.87–5.11)
RDW: 13.9 % (ref 11.5–15.5)
WBC: 5.4 10*3/uL (ref 4.0–10.5)
nRBC: 0 % (ref 0.0–0.2)

## 2024-05-07 NOTE — ED Triage Notes (Signed)
 Pt BIB [facility] from home with c/o blurred vision that began yesterday morning and has progressively worsened. Blind in right eye, poor peripheral vision in her left eye at baseline. Warm tingling sensation to the back of her nec, dx with paresthesia.   140/66 Hr 80 99% room air  CBG 108

## 2024-05-07 NOTE — ED Provider Triage Note (Signed)
 Emergency Medicine Provider Triage Evaluation Note  Jill Foley , a 59 y.o. female  was evaluated in triage.  Pt  BIB [facility] from home with tingling across the back of her headache. No headache. No falls/head trauma. Pt is legally blind in R eye d/t diabetes, and she c/o blurred vision in the left eye that began yesterday morning and has progressively worsened. Poor peripheral vision in her left eye at baseline. Does have a prior diagnosis of paresthesia but this was different. No leg/arm tingling, asymmetric weakness, slurred speech, confusion. Usually does wear a contact on the left eye but doesn't have it in. No ocular trauma or pain. No foreign body or grit sensation on L eye.   Review of Systems  Positive: L eye blurry vision, tingling in back of head Negative: Headache, asymmetric N/T/weakness, eye pain  Physical Exam  BP (!) 152/72 (BP Location: Right Arm)   Pulse 87   Temp 99 F (37.2 C)   Resp 18   LMP 09/07/2010   SpO2 100%  Gen:   Awake, no distress   Resp:  Normal effort  MSK:   Moves extremities without difficulty  Other:  No FNDs noted on exam  Medical Decision Making  Medically screening exam initiated at 11:39 AM.  Appropriate orders placed.  Jill Foley was informed that the remainder of the evaluation will be completed by another provider, this initial triage assessment does not replace that evaluation, and the importance of remaining in the ED until their evaluation is complete.  Patient is well-appearing, HDS. Out of window for any stroke-like sxs and sxs not consistent with LVO. Otherwise well-appearing. Ordering basic labs and visual acuity.    Franklyn Sid SAILOR, MD 05/07/24 [phone]

## 2024-05-07 NOTE — Discharge Instructions (Addendum)
 Call your eye doctor to be seen tomorrow for evaluation.

## 2024-05-08 NOTE — ED Provider Notes (Signed)
 " [place] [facility] Provider Note   CSN: [id number] Arrival date & time: 05/07/24  1027     Patient presents with: No chief complaint on file.   Jill Foley is a 59 y.o. female.   Patient complains of experiencing a tingling sensation to the back of her scalp.  Patient has had similar paresthesias in the past.  Patient also states that she feels like she has decreased vision in her left eye.  Patient is blind in her right eye.  Patient has had recent evaluation by neurology due to paresthesias and visual change.  She reports today that she felt like the paresthesia was involving more of her scalp than what had previously been involved.  Patient states that it mostly has been her neck in the past but now that it feels like it is going further up the back of her head.  Patient reports experiencing some tingling in the left side of her face.  Patient states she has not had any weakness.  Patient denies any difficulty with her gait she has not had any decrease in muscle strength.  Patient denies any difficulty with speech or hearing.  She denies any change to facial muscles.  Patient is followed closely by ophthalmology for her vision.  Patient has been diagnosed with TIAs in the past.  She has been seen by Dr. Rosemarie and diagnosed with paresthesias.  Patient had a MRI of her cervical spine in January.  Dr. Rosemarie felt like patient's symptoms were explained by her cervical changes   The history is provided by the patient. No language interpreter was used.       Prior to Admission medications  Medication Sig Start Date End Date Taking? Authorizing Provider  aspirin  81 MG chewable tablet Chew 1 tablet (81 mg total) by mouth daily. 02/27/24   Neldon Hamp RAMAN, PA  atorvastatin (LIPITOR) 40 MG tablet Take 40 mg by mouth daily.    [provider]  BIOTIN PO Take 10,000 Units by mouth daily.     [provider]  Calcium  Carb-Cholecalciferol 600-800 MG-UNIT TABS Take 1 tablet by mouth daily. Patient not taking: Reported on 04/08/2024    [provider]  carboxymethylcellulose (REFRESH TEARS) 0.5 % SOLN Apply 1-2 drops to eye.    [provider]  cholecalciferol (VITAMIN D3) 25 MCG (1000 UT) tablet Take 2,000 Units by mouth daily.    [provider]  clopidogrel  (PLAVIX ) 75 MG tablet Take 1 tablet (75 mg total) by mouth daily. 02/27/24   Neldon Hamp RAMAN, PA  Cod Liver Oil 1250-133 units CAPS Take 1 capsule by mouth.    [provider]  diphenhydrAMINE -zinc acetate (BENADRYL ) cream Apply 1 Application topically. 10/07/23   [provider]  famotidine (PEPCID) 20 MG tablet Take 20 mg by mouth. 09/22/21   [provider]  ferrous sulfate 325 (65 FE) MG tablet Take by mouth. Patient not taking: Reported on 04/08/2024    [provider]  glipiZIDE (GLUCOTROL XL) 2.5 MG 24 hr tablet Take 2.5 mg by mouth daily.    [provider]  hydrochlorothiazide (HYDRODIURIL) 12.5 MG tablet Take 12.5 mg by mouth daily.    [provider]  hydrocortisone 2.5 % cream Apply topically. 11/28/23   [provider]  ketotifen (ALAWAY) 0.025 % ophthalmic solution Apply to eye. 05/04/15   [provider]  lisinopril-hydrochlorothiazide (PRINZIDE,ZESTORETIC) 20-12.5 MG per tablet Take 1 tablet by mouth daily. Patient  not taking: Reported on 04/08/2024    [provider]  loratadine (CLARITIN) 10 MG tablet Take 10 mg by mouth. 06/16/19   [provider]  Magnesium Oxide 250 MG TABS Take 1 tablet by mouth daily.    [provider]  metFORMIN (GLUCOPHAGE-XR) 500 MG 24 hr tablet Take 1,000 mg by mouth. 12/09/19   [provider]  methylcellulose (ARTIFICIAL TEARS) 1 % ophthalmic solution Place 1 drop into both eyes 2 (two) times daily.    [provider]  MIEBO 1.338 GM/ML SOLN Apply 1 drop to eye 4 (four) times  daily.    [provider]  Multiple Vitamin (MULTIVITAMIN) capsule Take 1 capsule by mouth daily.    [provider]  ondansetron  (ZOFRAN  ODT) 4 MG disintegrating tablet Take 1 tablet (4 mg total) by mouth every 8 (eight) hours as needed for nausea or vomiting. Patient not taking: Reported on 04/08/2024 12/01/20   Neldon Hamp RAMAN, PA  pioglitazone (ACTOS) 30 MG tablet Take 30 mg by mouth daily.    [provider]  pravastatin  (PRAVACHOL ) 40 MG tablet Take 40 mg by mouth daily. Patient not taking: Reported on 04/08/2024    [provider]  sitaGLIPtin (JANUVIA) 50 MG tablet Take 50 mg by mouth daily. Patient not taking: Reported on 04/08/2024    [provider]  tizanidine (ZANAFLEX) 2 MG capsule Take 1 pill PO BID prn muscle spasm 04/09/23   [provider]  TRADJENTA 5 MG TABS tablet Take 5 mg by mouth daily. 08/03/23   [provider]  XIIDRA 5 % SOLN Apply 1 drop to eye 2 (two) times daily.    [provider]    Allergies: Fish allergy, Ivp dye [iodinated contrast media], and Xalatan [latanoprost]    Review of Systems  All other systems reviewed and are negative.   Updated Vital Signs BP (!) 158/68 (BP Location: Right Arm)   Pulse 85   Temp 97.9 F (36.6 C)   Resp 16   LMP 09/07/2010   SpO2 100%   Physical Exam Vitals and nursing note reviewed.  Constitutional:      Appearance: She is well-developed.  HENT:     Head: Normocephalic.     Right Ear: Tympanic membrane normal.     Left Ear: Tympanic membrane normal.     Nose: Nose normal.     Mouth/Throat:     Mouth: Mucous membranes are dry.  Cardiovascular:     Rate and Rhythm: Normal rate and regular rhythm.  Pulmonary:     Effort: Pulmonary effort is normal.  Abdominal:     General: Abdomen is flat. There is no distension.  Musculoskeletal:        General: Normal range of motion.     Cervical back: Normal range of motion.  Skin:    General: Skin is warm.   Neurological:     General: No focal deficit present.     Mental Status: She is alert and oriented to person, place, and time.     Comments: No weakness arms or legs normal strength normal gait,  Psychiatric:        Mood and Affect: Mood normal.     (all labs ordered are listed, but only abnormal results are displayed) Labs Reviewed  CBC WITH DIFFERENTIAL/PLATELET - Abnormal; Notable for the following components:      Result Value   Hemoglobin 11.6 (*)    All other components within normal limits  BASIC METABOLIC  PANEL WITH GFR - Abnormal; Notable for the following components:   Potassium 3.4 (*)    Glucose, Bld 100 (*)    Creatinine, Ser 1.07 (*)    GFR, Estimated 60 (*)    All other components within normal limits    EKG: None  Radiology: No results found.   Procedures   Medications Ordered in the ED - No data to display                                  Medical Decision Making Pt complains of decreased vision in her left eye.  Patient reports she has also had some paresthesias in the back of her scalp.  Patient has had similar in the past  Amount and/or Complexity of Data Reviewed External Data Reviewed: notes.    Details: I reviewed patient's previous MRIs and CTs.  I reviewed neurology notes.  Patient had recent assessment by stroke neurologist Dr. Rosemarie.  Patient had a normal brain MRI.  Patient did have some cervical disease that could be causing her paresthesias. Labs: ordered. Decision-making details documented in ED Course.    Details: Labs ordered reviewed and interpreted no acute findings  Risk Risk Details: Patient does not have any symptoms concerning for acute CVA.  I do not think patient is having a TIA.  Patient is advised to call her ophthalmologist to be seen in the a.m. for recheck of vision.  Patient is advised to follow-up with neurology.  Patient is advised to return to the emergency department if symptoms worsen or change.        Final  diagnoses:  Paresthesia  Visual complaint    ED Discharge Orders     None     An After Visit Summary was printed and given to the patient.      Carlisia Geno K, PA-C 05/08/24 1058    Patsey Lot, MD 05/08/24 1452  "

## 2024-06-03 ENCOUNTER — Inpatient Hospital Stay: Admitting: Neurology

## 2024-12-01 ENCOUNTER — Ambulatory Visit: Admitting: Neurology
